# Patient Record
Sex: Female | Born: 1975
Health system: Southern US, Community
[De-identification: ages and names within clinical notes are randomized; demographics above are authoritative.]

## PROBLEM LIST (undated history)

## (undated) DIAGNOSIS — Z789 Other specified health status: Secondary | ICD-10-CM

## (undated) DIAGNOSIS — D649 Anemia, unspecified: Secondary | ICD-10-CM

## (undated) DIAGNOSIS — K219 Gastro-esophageal reflux disease without esophagitis: Secondary | ICD-10-CM

## (undated) DIAGNOSIS — N809 Endometriosis, unspecified: Secondary | ICD-10-CM

## (undated) HISTORY — PX: ABDOMINAL HYSTERECTOMY: SHX81

## (undated) HISTORY — PX: NO PAST SURGERIES: SHX2092

---

## 2011-08-24 ENCOUNTER — Emergency Department (HOSPITAL_BASED_OUTPATIENT_CLINIC_OR_DEPARTMENT_OTHER)
Admission: EM | Admit: 2011-08-24 | Discharge: 2011-08-24 | Disposition: A | Discharge status: Home / Self Care | Payer: BC Managed Care – PPO | Attending: Emergency Medicine | Admitting: Emergency Medicine

## 2011-08-24 ENCOUNTER — Encounter (HOSPITAL_BASED_OUTPATIENT_CLINIC_OR_DEPARTMENT_OTHER): Payer: Self-pay | Admitting: *Deleted

## 2011-08-24 DIAGNOSIS — M549 Dorsalgia, unspecified: Secondary | ICD-10-CM

## 2011-08-24 MED ORDER — METHOCARBAMOL 500 MG PO TABS: 500.0000 mg | ORAL_TABLET | Freq: Two times a day (BID) | ORAL | Status: DC

## 2011-08-24 MED ORDER — HYDROCODONE-ACETAMINOPHEN 5-325 MG PO TABS: 2.0000 | ORAL_TABLET | ORAL | Status: DC | PRN

## 2011-08-24 NOTE — ED Notes (Signed)
Pt c/o low back pain and rib pain x 3 days without known injury

## 2011-08-24 NOTE — ED Provider Notes (Signed)
History     CSN: [id number]  Arrival date & time 08/24/11  2143   First MD Initiated Contact with Patient 08/24/11 2214      Chief Complaint  Patient presents with  . Back Pain    (Consider location/radiation/quality/duration/timing/severity/associated sxs/prior treatment) Patient is a 36 y.o. female presenting with back pain. The history is provided by the patient. No language interpreter was used.  Back Pain  This is a new problem. Episode onset: 3 days ago. The problem occurs constantly. The problem has been gradually worsening. The pain is associated with no known injury. The pain is present in the lumbar spine and thoracic spine. The quality of the pain is described as aching. The symptoms are aggravated by certain positions. The pain is the same all the time. Stiffness is present all day. She has tried nothing for the symptoms. The treatment provided moderate relief.  Pt complains of pain in her mid low back  History reviewed. No pertinent past medical history.  History reviewed. No pertinent past surgical history.  History reviewed. No pertinent family history.  History  Substance Use Topics  . Smoking status: Never Smoker   . Smokeless tobacco: Not on file  . Alcohol Use: No    OB History    Grav Para Term Preterm Abortions TAB SAB Ect Mult Living                  Review of Systems  Musculoskeletal: Positive for back pain.  All other systems reviewed and are negative.    Allergies  Review of patient's allergies indicates no known allergies.  Home Medications   Current Outpatient Rx  Name Route Sig Dispense Refill  . HYDROCODONE-ACETAMINOPHEN 5-325 MG PO TABS Oral Take 2 tablets by mouth every 4 (four) hours as needed for pain. 20 tablet 0  . METHOCARBAMOL 500 MG PO TABS Oral Take 1 tablet (500 mg total) by mouth 2 (two) times daily. 20 tablet 0    BP 147/86  Pulse 73  Temp 98.6 F (37 C) (Oral)  Resp 18  Ht 5\' 6"  (1.676 m)  Wt 200 lb (90.719 kg)   BMI 32.28 kg/m2  SpO2 100%  LMP 08/24/2011  Physical Exam  Nursing note and vitals reviewed. Constitutional: She appears well-developed and well-nourished.  HENT:  Head: Normocephalic and atraumatic.  Neck: Normal range of motion.  Cardiovascular: Normal rate and normal heart sounds.   Pulmonary/Chest: Effort normal.  Abdominal: Soft.  Musculoskeletal: She exhibits tenderness.       Tender ls spine from,  Ns and nv intact,   Neurological: She is alert.  Skin: Skin is warm.  Psychiatric: She has a normal mood and affect.    ED Course  Procedures (including critical care time)  Labs Reviewed - No data to display No results found.   1. Back pain       MDM  Robaxin and hydrocodone        Lonia Skinner [place], [place] 08/24/11 2248

## 2011-08-28 NOTE — ED Provider Notes (Signed)
Medical screening examination/treatment/procedure(s) were performed by non-physician practitioner and as supervising physician I was immediately available for consultation/collaboration.  Shadi Larner, MD 08/28/11 2355 

## 2011-09-22 ENCOUNTER — Encounter: Payer: Self-pay | Admitting: Family Medicine

## 2011-09-22 ENCOUNTER — Ambulatory Visit (INDEPENDENT_AMBULATORY_CARE_PROVIDER_SITE_OTHER): Payer: BC Managed Care – PPO | Admitting: Family Medicine

## 2011-09-22 ENCOUNTER — Ambulatory Visit (HOSPITAL_BASED_OUTPATIENT_CLINIC_OR_DEPARTMENT_OTHER)
Admission: RE | Admit: 2011-09-22 | Discharge: 2011-09-22 | Disposition: A | Discharge status: Home / Self Care | Payer: BC Managed Care – PPO | Source: Ambulatory Visit | Attending: Family Medicine | Admitting: Family Medicine

## 2011-09-22 VITALS — BP 124/85 | HR 70 | Ht 67.0 in | Wt 200.0 lb

## 2011-09-22 DIAGNOSIS — M545 Low back pain, unspecified: Secondary | ICD-10-CM | POA: Insufficient documentation

## 2011-09-22 DIAGNOSIS — M47817 Spondylosis without myelopathy or radiculopathy, lumbosacral region: Secondary | ICD-10-CM | POA: Insufficient documentation

## 2011-09-22 MED ORDER — HYDROCODONE-ACETAMINOPHEN 5-325 MG PO TABS: 1.0000 | ORAL_TABLET | Freq: Four times a day (QID) | ORAL | Status: AC | PRN

## 2011-09-22 MED ORDER — METHOCARBAMOL 500 MG PO TABS: 500.0000 mg | ORAL_TABLET | Freq: Three times a day (TID) | ORAL | Status: AC | PRN

## 2011-09-22 MED ORDER — MELOXICAM 15 MG PO TABS: 15.0000 mg | ORAL_TABLET | Freq: Every day | ORAL | Status: DC

## 2011-09-22 NOTE — Patient Instructions (Addendum)
You have a lumbar strain and right SI joint dysfunction. Meloxicam daily with food for pain and inflammation. Norco as needed for severe pain (no driving on this medicine). Robaxin as needed for muscle spasms (no driving on this medicine if it makes you sleepy). Stay as active as possible. Do home exercises and stretches as directed - hold each for 20-30 seconds and do each one three times. Consider massage, chiropractor, physical therapy, and/or acupuncture. Physical therapy has been shown to be helpful while the others have mixed results - we are starting this and they will teach you the home exercises. Strengthening of low back muscles, abdominal musculature are key for long term pain relief. If not improving, will consider further imaging (MRI). Follow up with me in 5-6 weeks for reevaluation.

## 2011-09-23 ENCOUNTER — Encounter: Payer: Self-pay | Admitting: Family Medicine

## 2011-09-23 DIAGNOSIS — M545 Low back pain: Secondary | ICD-10-CM | POA: Insufficient documentation

## 2011-09-23 NOTE — Progress Notes (Signed)
  Subjective:    Patient ID: [name] [name], female    DOB: Jul 09, 1975, 36 y.o.   MRN: [id number]  PCP: None  HPI 36 yo F here for low back pain.  Patient denies known injury. States ever since mid-April has had right > left low back pain worse at end of day making it hard to sleep. No radiation into legs. No numbness or tingling. No prior back problems. No bowel or bladder dysfunction. Tried motrin, heat, and ice.  History reviewed. No pertinent past medical history.  No current outpatient prescriptions on file prior to visit.    History reviewed. No pertinent past surgical history.  No Known Allergies  History   Social History  . Marital Status: Married    Spouse Name: N/A    Number of Children: N/A  . Years of Education: N/A   Occupational History  . Not on file.   Social History Main Topics  . Smoking status: Never Smoker   . Smokeless tobacco: Not on file  . Alcohol Use: No  . Drug Use: No  . Sexually Active:    Other Topics Concern  . Not on file   Social History Narrative  . No narrative on file    Family History  Problem Relation Age of Onset  . Sudden death Father   . Heart attack Father   . Diabetes Neg Hx   . Hyperlipidemia Neg Hx   . Hypertension Neg Hx     BP 124/85  Pulse 70  Ht 5\' 7"  (1.702 m)  Wt 200 lb (90.719 kg)  BMI 31.32 kg/m2  LMP 09/18/2011  Review of Systems See HPI above.    Objective:   Physical Exam Gen: NAD  Back: No gross deformity, scoliosis. TTP R SI joint and bilateral paraspinal muscles.  No midline or bony TTP. FROM with pain on full flexion. Strength LEs 5/5 all muscle groups.   1+ MSRs in patellar and achilles tendons, equal bilaterally. Negative SLRs. Sensation intact to light touch bilaterally. Negative logroll bilateral hips + fabers on right, negative left.  Negative piriformis stretches.    Assessment & Plan:  1. Low back pain - radiographs with mild DDD.  Pain 2/2 muscle strain and right SI joint  dysfunction.  Start meloxicam with norco (q6h prn #60) and robaxin as needed.  Start PT with transition to HEP.  F/u in 5-6 weeks.  Consider further imaging (MRI) if not improving as expected.

## 2011-09-23 NOTE — Assessment & Plan Note (Signed)
radiographs with mild DDD.  Pain 2/2 muscle strain and right SI joint dysfunction.  Start meloxicam with norco (q6h prn #60) and robaxin as needed.  Start PT with transition to HEP.  F/u in 5-6 weeks.  Consider further imaging (MRI) if not improving as expected.

## 2011-10-04 ENCOUNTER — Encounter (HOSPITAL_BASED_OUTPATIENT_CLINIC_OR_DEPARTMENT_OTHER): Payer: Self-pay | Admitting: *Deleted

## 2011-10-04 ENCOUNTER — Emergency Department (HOSPITAL_BASED_OUTPATIENT_CLINIC_OR_DEPARTMENT_OTHER)
Admission: EM | Admit: 2011-10-04 | Discharge: 2011-10-04 | Disposition: A | Discharge status: Home / Self Care | Payer: BC Managed Care – PPO | Attending: Emergency Medicine | Admitting: Emergency Medicine

## 2011-10-04 DIAGNOSIS — R109 Unspecified abdominal pain: Secondary | ICD-10-CM | POA: Insufficient documentation

## 2011-10-04 LAB — URINALYSIS, ROUTINE W REFLEX MICROSCOPIC
Bilirubin Urine: NEGATIVE
Leukocytes, UA: NEGATIVE
Nitrite: NEGATIVE
Specific Gravity, Urine: 1.027 (ref 1.005–1.030)
pH: 6 (ref 5.0–8.0)

## 2011-10-04 LAB — PREGNANCY, URINE: Preg Test, Ur: NEGATIVE

## 2011-10-04 MED ORDER — HYDROCODONE-ACETAMINOPHEN 5-500 MG PO TABS: 1.0000 | ORAL_TABLET | Freq: Four times a day (QID) | ORAL | Status: AC | PRN

## 2011-10-04 MED ORDER — KETOROLAC TROMETHAMINE 30 MG/ML IJ SOLN
30.0000 mg | Freq: Once | INTRAMUSCULAR | Status: AC
  Administered 2011-10-04: 30 mg via INTRAMUSCULAR
  Filled 2011-10-04: qty 1

## 2011-10-04 MED ORDER — IBUPROFEN 600 MG PO TABS: 600.0000 mg | ORAL_TABLET | Freq: Three times a day (TID) | ORAL | Status: AC | PRN

## 2011-10-04 NOTE — ED Provider Notes (Signed)
History     CSN: [id number]  Arrival date & time 10/04/11  4098   First MD Initiated Contact with Patient 10/04/11 0450      Chief Complaint  Patient presents with  . Abdominal Pain    (Consider location/radiation/quality/duration/timing/severity/associated sxs/prior treatment) Patient is a 36 y.o. female presenting with abdominal pain. The history is provided by the patient.  Abdominal Pain The primary symptoms of the illness include abdominal pain. The primary symptoms of the illness do not include fever, shortness of breath, diarrhea, dysuria, vaginal discharge or vaginal bleeding.  Symptoms associated with the illness do not include constipation or back pain.  pt states awoke this morning with a sharp stabbing pain to suprapubic area. Constant. No radiation. No exacerbating or alleviating factors. Felt normal when went to bed last pm. lnmp 2 weeks ago. No vaginal discharge or bleeding. No urgency or frequency. No dysuria. No back or flank pain. No abd distension. Normal appetite. No nvd. Having normal bms. No constipation. No hx ovarian cysts. No hx kidney stones.     History reviewed. No pertinent past medical history.  History reviewed. No pertinent past surgical history.  Family History  Problem Relation Age of Onset  . Sudden death Father   . Heart attack Father   . Diabetes Neg Hx   . Hyperlipidemia Neg Hx   . Hypertension Neg Hx     History  Substance Use Topics  . Smoking status: Never Smoker   . Smokeless tobacco: Not on file  . Alcohol Use: No    OB History    Grav Para Term Preterm Abortions TAB SAB Ect Mult Living                  Review of Systems  Constitutional: Negative for fever.  Respiratory: Negative for shortness of breath.   Cardiovascular: Negative for chest pain.  Gastrointestinal: Positive for abdominal pain. Negative for diarrhea and constipation.  Genitourinary: Negative for dysuria, flank pain, vaginal bleeding and vaginal discharge.   Musculoskeletal: Negative for back pain.  Neurological: Negative for headaches.    Allergies  Review of patient's allergies indicates no known allergies.  Home Medications   Current Outpatient Rx  Name Route Sig Dispense Refill  . MELOXICAM 15 MG PO TABS Oral Take 1 tablet (15 mg total) by mouth daily. With food. 30 tablet 1    BP 127/73  Pulse 73  Temp 97.3 F (36.3 C) (Oral)  Resp 18  SpO2 100%  LMP 09/18/2011  Physical Exam  Nursing note and vitals reviewed. Constitutional: She is oriented to person, place, and time. She appears well-developed and well-nourished. No distress.  Eyes: Conjunctivae are normal. No scleral icterus.  Neck: Neck supple. No tracheal deviation present.  Cardiovascular: Normal rate.   Pulmonary/Chest: Effort normal. No respiratory distress.  Abdominal: Soft. Normal appearance and bowel sounds are normal. She exhibits no distension and no mass. There is no tenderness. There is no rebound and no guarding.  Genitourinary:       No cva tenderness  Musculoskeletal: She exhibits no edema.  Neurological: She is alert and oriented to person, place, and time.  Skin: Skin is warm and dry. No rash noted.  Psychiatric: She has a normal mood and affect.    ED Course  Procedures (including critical care time)   Labs Reviewed  URINALYSIS, ROUTINE W REFLEX MICROSCOPIC  PREGNANCY, URINE   Results for orders placed during the hospital encounter of 10/04/11  URINALYSIS, ROUTINE W REFLEX MICROSCOPIC  Component Value Range   Color, Urine YELLOW  YELLOW   APPearance CLEAR  CLEAR   Specific Gravity, Urine 1.027  1.005 - 1.030   pH 6.0  5.0 - 8.0   Glucose, UA NEGATIVE  NEGATIVE mg/dL   Hgb urine dipstick NEGATIVE  NEGATIVE   Bilirubin Urine NEGATIVE  NEGATIVE   Ketones, ur NEGATIVE  NEGATIVE mg/dL   Protein, ur NEGATIVE  NEGATIVE mg/dL   Urobilinogen, UA 0.2  0.0 - 1.0 mg/dL   Nitrite NEGATIVE  NEGATIVE   Leukocytes, UA NEGATIVE  NEGATIVE    PREGNANCY, URINE      Component Value Range   Preg Test, Ur NEGATIVE  NEGATIVE       MDM  Labs.  Ua/upreg neg. Pt drove self to ed. toradol im.  Discussed diff dx w pt including mittelschmerz, ruptured ovarian cyst. abd exam soft nt. No gu c/o or hematuria.        Suzi Roots, MD 10/04/11 [phone]

## 2011-10-04 NOTE — ED Notes (Addendum)
[facility] radiology called and given information regarding an u/s transvaginal if pt continues to have pain. They state scheduler  will call pt. this morning. Pt. aware of this. Order for u/s sent with pt.

## 2011-10-04 NOTE — ED Notes (Signed)
C/o lower pelvic pain that woke pt. Up from her sleep 2 hours ago. Describes as a sharp/stabbing type pain no radiation of pain.  Has had nausea denies vomiting. Denies fevers. Denies any urinary symptoms.

## 2011-10-04 NOTE — ED Notes (Signed)
MD at bedside. 

## 2011-10-04 NOTE — ED Notes (Signed)
Waiting for xray to speak with pt. Regarding her u/s.

## 2011-10-06 ENCOUNTER — Inpatient Hospital Stay (HOSPITAL_BASED_OUTPATIENT_CLINIC_OR_DEPARTMENT_OTHER): Admit: 2011-10-06 | Payer: BC Managed Care – PPO

## 2011-10-06 ENCOUNTER — Ambulatory Visit: Payer: BC Managed Care – PPO | Attending: Family Medicine | Admitting: Rehabilitation

## 2011-11-03 ENCOUNTER — Ambulatory Visit: Payer: BC Managed Care – PPO | Admitting: Family Medicine

## 2011-11-06 ENCOUNTER — Other Ambulatory Visit (HOSPITAL_COMMUNITY)
Admission: RE | Admit: 2011-11-06 | Discharge: 2011-11-06 | Disposition: A | Discharge status: Home / Self Care | Payer: BC Managed Care – PPO | Source: Ambulatory Visit | Attending: Obstetrics & Gynecology | Admitting: Obstetrics & Gynecology

## 2011-11-06 ENCOUNTER — Other Ambulatory Visit: Payer: Self-pay | Admitting: Family Medicine

## 2011-11-06 DIAGNOSIS — N63 Unspecified lump in unspecified breast: Secondary | ICD-10-CM

## 2011-11-06 DIAGNOSIS — Z1151 Encounter for screening for human papillomavirus (HPV): Secondary | ICD-10-CM | POA: Insufficient documentation

## 2011-11-06 DIAGNOSIS — Z124 Encounter for screening for malignant neoplasm of cervix: Secondary | ICD-10-CM | POA: Insufficient documentation

## 2011-11-07 ENCOUNTER — Ambulatory Visit
Admission: RE | Admit: 2011-11-07 | Discharge: 2011-11-07 | Disposition: A | Discharge status: Home / Self Care | Payer: BC Managed Care – PPO | Source: Ambulatory Visit | Attending: Family Medicine | Admitting: Family Medicine

## 2011-11-07 DIAGNOSIS — N63 Unspecified lump in unspecified breast: Secondary | ICD-10-CM

## 2011-11-09 ENCOUNTER — Encounter (HOSPITAL_BASED_OUTPATIENT_CLINIC_OR_DEPARTMENT_OTHER): Payer: Self-pay

## 2011-11-09 ENCOUNTER — Emergency Department (HOSPITAL_BASED_OUTPATIENT_CLINIC_OR_DEPARTMENT_OTHER)
Admission: EM | Admit: 2011-11-09 | Discharge: 2011-11-09 | Disposition: A | Discharge status: Home / Self Care | Payer: BC Managed Care – PPO | Attending: Emergency Medicine | Admitting: Emergency Medicine

## 2011-11-09 DIAGNOSIS — R109 Unspecified abdominal pain: Secondary | ICD-10-CM | POA: Insufficient documentation

## 2011-11-09 DIAGNOSIS — N809 Endometriosis, unspecified: Secondary | ICD-10-CM

## 2011-11-09 HISTORY — DX: Endometriosis, unspecified: N80.9

## 2011-11-09 MED ORDER — IBUPROFEN 800 MG PO TABS
800.0000 mg | ORAL_TABLET | Freq: Once | ORAL | Status: AC
  Administered 2011-11-09: 800 mg via ORAL
  Filled 2011-11-09: qty 1

## 2011-11-09 MED ORDER — IBUPROFEN 800 MG PO TABS: ORAL_TABLET | ORAL | Status: DC

## 2011-11-09 MED ORDER — HYDROMORPHONE HCL 2 MG PO TABS: 2.0000 mg | ORAL_TABLET | ORAL | Status: DC | PRN

## 2011-11-09 NOTE — ED Notes (Signed)
MD @ BS

## 2011-11-09 NOTE — ED Provider Notes (Signed)
History     CSN: [id number]  Arrival date & time 11/09/11  0411   First MD Initiated Contact with Patient 11/09/11 0424      Chief Complaint  Patient presents with  . Abdominal Pain    (Consider location/radiation/quality/duration/timing/severity/associated sxs/prior treatment) HPI This is a 36 year old white female with about a four-month history of irregular periods and a several month history of pelvic pain. She was seen by her OB/GYN 2 days ago and given a diagnosis of endometriosis. She relates that on speculum exam her physician saw 7 cysts which she said were consistent with endometriosis. She is having vaginal bleeding which and present for several days. She is due to start birth control today to help regulate her cycles breast endometrium. She was treated with oxycodone 5 mg tablets which have not adequately relieved her pain. Her pain became severe and she was unable to sleep. She has also been taking ibuprofen without relief. She is here requesting pain relief pending her followup appointment in 4 days.  The pain is sharp and located in her left suprapubic region. It is worse with palpation or movement.  Past Medical History  Diagnosis Date  . Endometriosis     History reviewed. No pertinent past surgical history.  Family History  Problem Relation Age of Onset  . Sudden death Father   . Heart attack Father   . Diabetes Neg Hx   . Hyperlipidemia Neg Hx   . Hypertension Neg Hx     History  Substance Use Topics  . Smoking status: Never Smoker   . Smokeless tobacco: Not on file  . Alcohol Use: No    OB History    Grav Para Term Preterm Abortions TAB SAB Ect Mult Living                  Review of Systems  All other systems reviewed and are negative.    Allergies  Review of patient's allergies indicates no known allergies.  Home Medications   Current Outpatient Rx  Name Route Sig Dispense Refill  . OXYCODONE HCL 5 MG PO TABS Oral Take 5 mg by mouth  every 4 (four) hours as needed.      BP 109/75  Pulse 91  Temp 98.3 F (36.8 C)  Resp 16  SpO2 99%  Physical Exam General: Well-developed, well-nourished female in no acute distress; appearance consistent with age of record HENT: normocephalic, atraumatic Eyes: pupils equal round and reactive to light; extraocular muscles intact Neck: supple Heart: regular rate and rhythm Lungs: clear to auscultation bilaterally Abdomen: soft; nondistended; left suprapubic tenderness; bowel sounds present Extremities: No deformity; full range of motion Neurologic: Awake, alert and oriented; motor function intact in all extremities and symmetric; no facial droop Skin: Warm and dry Psychiatric: Normal mood and affect    ED Course  Procedures (including critical care time)     MDM  We'll change her analgesics and have her call her OB/GYN tomorrow. She was advised to start her birth control today as instructed.        Hanley Seamen, MD 11/09/11 [phone]

## 2011-11-09 NOTE — ED Notes (Signed)
Patient reports that she was recently seen at GYN and diagnosed with increasing endometriosis. Taking oxycodone w/o relief. Having irregular bleeding and backpain

## 2011-12-06 ENCOUNTER — Encounter (HOSPITAL_BASED_OUTPATIENT_CLINIC_OR_DEPARTMENT_OTHER): Payer: Self-pay | Admitting: *Deleted

## 2011-12-06 ENCOUNTER — Emergency Department (HOSPITAL_BASED_OUTPATIENT_CLINIC_OR_DEPARTMENT_OTHER)
Admission: EM | Admit: 2011-12-06 | Discharge: 2011-12-06 | Disposition: A | Discharge status: Home / Self Care | Payer: BC Managed Care – PPO | Attending: Emergency Medicine | Admitting: Emergency Medicine

## 2011-12-06 DIAGNOSIS — N809 Endometriosis, unspecified: Secondary | ICD-10-CM | POA: Insufficient documentation

## 2011-12-06 LAB — PREGNANCY, URINE: Preg Test, Ur: NEGATIVE

## 2011-12-06 MED ORDER — ONDANSETRON HCL 4 MG/2ML IJ SOLN
4.0000 mg | Freq: Once | INTRAMUSCULAR | Status: AC
  Administered 2011-12-06: 4 mg via INTRAVENOUS
  Filled 2011-12-06: qty 2

## 2011-12-06 MED ORDER — HYDROMORPHONE HCL PF 1 MG/ML IJ SOLN
1.0000 mg | Freq: Once | INTRAMUSCULAR | Status: AC
  Administered 2011-12-06: 1 mg via INTRAVENOUS
  Filled 2011-12-06: qty 1

## 2011-12-06 MED ORDER — OXYCODONE HCL ER 10 MG PO T12A: 10.0000 mg | EXTENDED_RELEASE_TABLET | Freq: Two times a day (BID) | ORAL | Status: DC

## 2011-12-06 NOTE — ED Provider Notes (Signed)
History   This chart was scribed for Nelia Shi, MD by Albertha Ghee Rifaie. This patient was seen in room MH06/MH06 and the patient's care was started at 7:07 PM.   CSN: [id number]  Arrival date & time 12/06/11  1711   First MD Initiated Contact with Patient 12/06/11 1907      Chief Complaint  Patient presents with  . Abdominal Pain    Patient is a 36 y.o. female presenting with abdominal pain. The history is provided by the patient. No language interpreter was used.  Abdominal Pain The primary symptoms of the illness include abdominal pain.    [name] [name] is a 36 y.o. female who presents to the Emergency Department complaining of a month of gradual onset, gradually worsening, sever, intermittent abd pain that is associated with vomiting, and heavy period. Pt was seen several times by her PCP (Dr. [name]) for the same symptoms and was prescribed Tramadol and Motrin but didn't provide any relief. She also states that she was diagnosed with endometriosis and have had multiple appt, and biopsies. She denis having fever, chills as associated symptoms. Pt denies smoking and alcohol use.     Past Medical History  Diagnosis Date  . Endometriosis     History reviewed. No pertinent past surgical history.  Family History  Problem Relation Age of Onset  . Sudden death Father   . Heart attack Father   . Diabetes Neg Hx   . Hyperlipidemia Neg Hx   . Hypertension Neg Hx     History  Substance Use Topics  . Smoking status: Never Smoker   . Smokeless tobacco: Not on file  . Alcohol Use: No    No OB history provided  Review of Systems  Gastrointestinal: Positive for abdominal pain.  All other systems reviewed and are negative.    Allergies  Tylenol  Home Medications   Current Outpatient Rx  Name Route Sig Dispense Refill  . HYDROMORPHONE HCL 2 MG PO TABS Oral Take 1 tablet (2 mg total) by mouth every 4 (four) hours as needed for pain. 20 tablet 0  . IBUPROFEN 800 MG  PO TABS  Take 1 tablet every 8 hours as needed for uterine cramping. 30 tablet 0  . OXYCODONE HCL 5 MG PO TABS Oral Take 5 mg by mouth every 4 (four) hours as needed.      BP 125/80  Pulse 97  Temp 98.9 F (37.2 C) (Oral)  Resp 20  Ht 5\' 7"  (1.702 m)  Wt 215 lb (97.523 kg)  BMI 33.67 kg/m2  SpO2 97%  Physical Exam  Nursing note and vitals reviewed. Constitutional: She is oriented to person, place, and time. She appears well-developed and well-nourished. No distress.  HENT:  Head: Normocephalic and atraumatic.  Eyes: Pupils are equal, round, and reactive to light.  Neck: Normal range of motion.  Cardiovascular: Normal rate and intact distal pulses.   Pulmonary/Chest: No respiratory distress.  Abdominal: Normal appearance. She exhibits no distension. There is no tenderness. There is no rebound and no guarding.  Musculoskeletal: Normal range of motion.  Neurological: She is alert and oriented to person, place, and time. No cranial nerve deficit.  Skin: Skin is warm and dry. No rash noted.  Psychiatric: She has a normal mood and affect. Her behavior is normal.    ED Course  Procedures (including critical care time)  Medications  HYDROmorphone (DILAUDID) injection 1 mg (not administered)  ondansetron (ZOFRAN) injection 4 mg (4 mg Intravenous Given  12/06/11 1945)  HYDROmorphone (DILAUDID) injection 1 mg (1 mg Intravenous Given 12/06/11 1945)     Labs Reviewed  PREGNANCY, URINE   No results found.  DIAGNOSTIC STUDIES: Oxygen Saturation is 97% on room air, adequate by my interpretation.    COORDINATION OF CARE: 7:17 PM Discussed treatment plan with pt at bedside and pt agreed to plan.  No diagnosis found.    MDM  I personally performed the services described in this documentation, which was scribed in my presence. The recorded information has been reviewed and considered.    Patient has outpatient followup for this condition and has been worked up extensively.   Denies visit was strictly for pain control.  After treatment in the ED the patient feels back to baseline and wants to go home.   Nelia Shi, MD 12/06/11 2033

## 2011-12-06 NOTE — ED Notes (Signed)
Pt with plan for treatment for endometriosis but poor pain control.

## 2011-12-06 NOTE — ED Notes (Signed)
Pt states she was here about 1-1/2 months ago for similar s/s. Has had multiple appt, biopsies, etc. since and they have told her she has endometriosis. C/O heavy periods, pain.No relief with Tramadol or Motrin.

## 2011-12-14 ENCOUNTER — Emergency Department (HOSPITAL_BASED_OUTPATIENT_CLINIC_OR_DEPARTMENT_OTHER)
Admission: EM | Admit: 2011-12-14 | Discharge: 2011-12-14 | Disposition: A | Discharge status: Home / Self Care | Payer: BC Managed Care – PPO | Attending: Emergency Medicine | Admitting: Emergency Medicine

## 2011-12-14 ENCOUNTER — Encounter (HOSPITAL_BASED_OUTPATIENT_CLINIC_OR_DEPARTMENT_OTHER): Payer: Self-pay | Admitting: *Deleted

## 2011-12-14 DIAGNOSIS — N809 Endometriosis, unspecified: Secondary | ICD-10-CM | POA: Insufficient documentation

## 2011-12-14 MED ORDER — HYDROMORPHONE HCL PF 2 MG/ML IJ SOLN
2.0000 mg | Freq: Once | INTRAMUSCULAR | Status: AC
  Administered 2011-12-14: 2 mg via INTRAMUSCULAR
  Filled 2011-12-14: qty 1

## 2011-12-14 MED ORDER — OXYCODONE HCL 7.5 MG PO TABS: 1.0000 | ORAL_TABLET | Freq: Four times a day (QID) | ORAL | Status: DC | PRN

## 2011-12-14 NOTE — ED Provider Notes (Signed)
History     CSN: [id number]  Arrival date & time 12/14/11  4098   First MD Initiated Contact with Patient 12/14/11 [phone]      Chief Complaint  Patient presents with  . Abdominal Pain    (Consider location/radiation/quality/duration/timing/severity/associated sxs/prior treatment) HPI Comments: Patient with history of endometriosis followed by Gyn.  She is currently taking tramadol for her pain which is not helping.  She called her Gyn who told her they could not call in any medications and she should go to the ER.  She reports this pain is identical to her endometriosis pain and she has no other symptoms such as dysuria or hematuria.  No fevers.  Patient is a 36 y.o. female presenting with abdominal pain. The history is provided by the patient.  Abdominal Pain The primary symptoms of the illness include abdominal pain. The primary symptoms of the illness do not include fever, nausea, vomiting, dysuria or vaginal bleeding. Episode onset: several months ago. The onset of the illness was gradual. The problem has been gradually worsening.  The patient states that she believes she is currently not pregnant. The patient has not had a change in bowel habit. Symptoms associated with the illness do not include chills.    Past Medical History  Diagnosis Date  . Endometriosis     History reviewed. No pertinent past surgical history.  Family History  Problem Relation Age of Onset  . Sudden death Father   . Heart attack Father   . Diabetes Neg Hx   . Hyperlipidemia Neg Hx   . Hypertension Neg Hx     History  Substance Use Topics  . Smoking status: Never Smoker   . Smokeless tobacco: Not on file  . Alcohol Use: No    OB History    Grav Para Term Preterm Abortions TAB SAB Ect Mult Living                  Review of Systems  Constitutional: Negative for fever and chills.  Gastrointestinal: Positive for abdominal pain. Negative for nausea and vomiting.  Genitourinary: Negative for  dysuria and vaginal bleeding.  All other systems reviewed and are negative.    Allergies  Tylenol  Home Medications   Current Outpatient Rx  Name Route Sig Dispense Refill  . NORGESTIM-ETH ESTRAD TRIPHASIC 0.18/0.215/0.25 MG-25 MCG PO TABS Oral Take 1 tablet by mouth daily.    Marland Kitchen HYDROMORPHONE HCL 2 MG PO TABS Oral Take 1 tablet (2 mg total) by mouth every 4 (four) hours as needed for pain. 20 tablet 0  . IBUPROFEN 800 MG PO TABS  Take 1 tablet every 8 hours as needed for uterine cramping. 30 tablet 0  . OXYCODONE HCL 5 MG PO TABS Oral Take 5 mg by mouth every 4 (four) hours as needed.    . OXYCODONE HCL ER 10 MG PO T12A Oral Take 1 tablet (10 mg total) by mouth every 12 (twelve) hours. 20 tablet 0    BP 130/80  Pulse 73  Temp 98.4 F (36.9 C) (Oral)  Resp 20  Ht 5\' 7"  (1.702 m)  Wt 215 lb (97.523 kg)  BMI 33.67 kg/m2  SpO2 100%  LMP 09/13/2011  Physical Exam  Nursing note and vitals reviewed. Constitutional: She is oriented to person, place, and time. She appears well-developed and well-nourished.  HENT:  Head: Normocephalic and atraumatic.  Mouth/Throat: Oropharynx is clear and moist.  Neck: Normal range of motion. Neck supple.  Cardiovascular: Normal rate and  regular rhythm.   No murmur heard. Pulmonary/Chest: Effort normal and breath sounds normal.  Abdominal: Soft. Bowel sounds are normal.       There is mild ttp in the lower abdomen without rebound or guarding.    Musculoskeletal: Normal range of motion.  Neurological: She is alert and oriented to person, place, and time.  Skin: Skin is warm and dry.    ED Course  Procedures (including critical care time)  Labs Reviewed - No data to display No results found.   No diagnosis found.    MDM  I will prescribe oxycodone for her today.  She has been advised that she needs follow up with her Gyn this week for follow up.  I do not feel as though any additional workup is indicated today.        Geoffery Lyons, MD 12/14/11 [phone]

## 2011-12-14 NOTE — ED Notes (Signed)
Patient states she has endometriosis pain for several months and is working with her GYN.  States the abdominal pain is increasing and tramadol is not helping.  Has been on BC pills for several months and for the last few days she had been spotting.

## 2012-01-10 ENCOUNTER — Emergency Department (HOSPITAL_BASED_OUTPATIENT_CLINIC_OR_DEPARTMENT_OTHER)
Admission: EM | Admit: 2012-01-10 | Discharge: 2012-01-10 | Disposition: A | Discharge status: Home / Self Care | Payer: BC Managed Care – PPO | Attending: Emergency Medicine | Admitting: Emergency Medicine

## 2012-01-10 ENCOUNTER — Encounter (HOSPITAL_BASED_OUTPATIENT_CLINIC_OR_DEPARTMENT_OTHER): Payer: Self-pay | Admitting: Emergency Medicine

## 2012-01-10 DIAGNOSIS — Z79899 Other long term (current) drug therapy: Secondary | ICD-10-CM | POA: Insufficient documentation

## 2012-01-10 DIAGNOSIS — N809 Endometriosis, unspecified: Secondary | ICD-10-CM | POA: Insufficient documentation

## 2012-01-10 LAB — URINALYSIS, ROUTINE W REFLEX MICROSCOPIC
Bilirubin Urine: NEGATIVE
Hgb urine dipstick: NEGATIVE
Ketones, ur: NEGATIVE mg/dL
Nitrite: NEGATIVE
Protein, ur: NEGATIVE mg/dL
Urobilinogen, UA: 0.2 mg/dL (ref 0.0–1.0)

## 2012-01-10 LAB — PREGNANCY, URINE: Preg Test, Ur: NEGATIVE

## 2012-01-10 MED ORDER — KETOROLAC TROMETHAMINE 30 MG/ML IJ SOLN
30.0000 mg | Freq: Once | INTRAMUSCULAR | Status: AC
  Administered 2012-01-10: 30 mg via INTRAVENOUS
  Filled 2012-01-10: qty 1

## 2012-01-10 MED ORDER — HYDROMORPHONE HCL PF 1 MG/ML IJ SOLN
1.0000 mg | Freq: Once | INTRAMUSCULAR | Status: AC
  Administered 2012-01-10: 1 mg via INTRAVENOUS
  Filled 2012-01-10: qty 1

## 2012-01-10 MED ORDER — ONDANSETRON 4 MG PO TBDP: 4.0000 mg | ORAL_TABLET | Freq: Three times a day (TID) | ORAL | Status: DC | PRN

## 2012-01-10 MED ORDER — ONDANSETRON HCL 4 MG/2ML IJ SOLN
4.0000 mg | Freq: Once | INTRAMUSCULAR | Status: AC
  Administered 2012-01-10: 4 mg via INTRAVENOUS
  Filled 2012-01-10: qty 2

## 2012-01-10 MED ORDER — HYDROCODONE-ACETAMINOPHEN 5-500 MG PO TABS: 1.0000 | ORAL_TABLET | Freq: Four times a day (QID) | ORAL | Status: DC | PRN

## 2012-01-10 NOTE — ED Notes (Signed)
Pt having abdominal pain, which is chronic for pt.  Pt states her endometriosis is causing the pain and that she is planning a hysterectomy in December.  Some N/V/D.  No vaginal discharge.

## 2012-01-10 NOTE — ED Provider Notes (Signed)
History     CSN: [id number]  Arrival date & time 01/10/12  4098   First MD Initiated Contact with Patient 01/10/12 1003      Chief Complaint  Patient presents with  . Abdominal Cramping     HPI Patient presents with chief complaint of abdominal pain.  Patient has history of endometriosis and pain that she is experiencing is similar or identical to that.  Patient denies any fever.  Patient has had some loose stools.  Patient denies any nausea or vomiting.  Patient is scheduling of hysterectomy prior to the holidays.  Patient has had workup for similar pains in the past. Past Medical History  Diagnosis Date  . Endometriosis     No past surgical history on file.  Family History  Problem Relation Age of Onset  . Sudden death Father   . Heart attack Father   . Diabetes Neg Hx   . Hyperlipidemia Neg Hx   . Hypertension Neg Hx     History  Substance Use Topics  . Smoking status: Never Smoker   . Smokeless tobacco: Not on file  . Alcohol Use: No    OB History    Grav Para Term Preterm Abortions TAB SAB Ect Mult Living                  Review of Systems All other systems reviewed and are negative Allergies  Tylenol  Home Medications   Current Outpatient Rx  Name  Route  Sig  Dispense  Refill  . HYDROCODONE-ACETAMINOPHEN 5-500 MG PO TABS   Oral   Take 1-2 tablets by mouth every 6 (six) hours as needed for pain.   20 tablet   0   . IBUPROFEN 800 MG PO TABS      Take 1 tablet every 8 hours as needed for uterine cramping.   30 tablet   0   . NORGESTIM-ETH ESTRAD TRIPHASIC 0.18/0.215/0.25 MG-25 MCG PO TABS   Oral   Take 1 tablet by mouth daily.         Marland Kitchen ONDANSETRON 4 MG PO TBDP   Oral   Take 1 tablet (4 mg total) by mouth every 8 (eight) hours as needed for nausea.   20 tablet   0     BP 130/81  Pulse 80  Temp 99.1 F (37.3 C) (Oral)  Resp 16  Ht 5\' 7"  (1.702 m)  Wt 206 lb (93.441 kg)  BMI 32.26 kg/m2  SpO2 98%  LMP 12/24/2011  Physical  Exam  Nursing note and vitals reviewed. Constitutional: She is oriented to person, place, and time. She appears well-developed and well-nourished. No distress.  HENT:  Head: Normocephalic and atraumatic.  Eyes: Pupils are equal, round, and reactive to light.  Neck: Normal range of motion.  Cardiovascular: Normal rate and intact distal pulses.   Pulmonary/Chest: No respiratory distress.  Abdominal: Normal appearance. She exhibits no distension. There is no tenderness. There is no rebound and no guarding.  Musculoskeletal: Normal range of motion.  Neurological: She is alert and oriented to person, place, and time. No cranial nerve deficit.  Skin: Skin is warm and dry. No rash noted.  Psychiatric: She has a normal mood and affect. Her behavior is normal.    ED Course  Procedures (including critical care time)  Medications  HYDROcodone-acetaminophen (VICODIN) 5-500 MG per tablet (not administered)  ondansetron (ZOFRAN ODT) 4 MG disintegrating tablet (not administered)  ondansetron (ZOFRAN) injection 4 mg (4 mg Intravenous Given 01/10/12  1017)  ketorolac (TORADOL) 30 MG/ML injection 30 mg (30 mg Intravenous Given 01/10/12 1017)  HYDROmorphone (DILAUDID) injection 1 mg (1 mg Intravenous Given 01/10/12 1017)     Labs Reviewed  PREGNANCY, URINE  URINALYSIS, ROUTINE W REFLEX MICROSCOPIC   No results found.   1. Endometriosis       MDM   After treatment in the ED the patient feels back to baseline and wants to go home.        Nelia Shi, MD 01/10/12 [phone]

## 2012-01-20 ENCOUNTER — Emergency Department (HOSPITAL_BASED_OUTPATIENT_CLINIC_OR_DEPARTMENT_OTHER)
Admission: EM | Admit: 2012-01-20 | Discharge: 2012-01-20 | Disposition: A | Discharge status: Home / Self Care | Payer: BC Managed Care – PPO | Attending: Emergency Medicine | Admitting: Emergency Medicine

## 2012-01-20 DIAGNOSIS — Z791 Long term (current) use of non-steroidal anti-inflammatories (NSAID): Secondary | ICD-10-CM | POA: Insufficient documentation

## 2012-01-20 DIAGNOSIS — N949 Unspecified condition associated with female genital organs and menstrual cycle: Secondary | ICD-10-CM | POA: Insufficient documentation

## 2012-01-20 DIAGNOSIS — R112 Nausea with vomiting, unspecified: Secondary | ICD-10-CM | POA: Insufficient documentation

## 2012-01-20 DIAGNOSIS — M549 Dorsalgia, unspecified: Secondary | ICD-10-CM | POA: Insufficient documentation

## 2012-01-20 DIAGNOSIS — Z79899 Other long term (current) drug therapy: Secondary | ICD-10-CM | POA: Insufficient documentation

## 2012-01-20 DIAGNOSIS — N809 Endometriosis, unspecified: Secondary | ICD-10-CM | POA: Insufficient documentation

## 2012-01-20 LAB — URINALYSIS, ROUTINE W REFLEX MICROSCOPIC
Bilirubin Urine: NEGATIVE
Glucose, UA: NEGATIVE mg/dL
Hgb urine dipstick: NEGATIVE
Protein, ur: NEGATIVE mg/dL
Urobilinogen, UA: 0.2 mg/dL (ref 0.0–1.0)

## 2012-01-20 MED ORDER — KETOROLAC TROMETHAMINE 60 MG/2ML IM SOLN
60.0000 mg | Freq: Once | INTRAMUSCULAR | Status: AC
  Administered 2012-01-20: 60 mg via INTRAMUSCULAR
  Filled 2012-01-20: qty 2

## 2012-01-20 MED ORDER — OXYCODONE-ACETAMINOPHEN 5-325 MG PO TABS: 2.0000 | ORAL_TABLET | ORAL | Status: DC | PRN

## 2012-01-20 MED ORDER — HYDROMORPHONE HCL PF 2 MG/ML IJ SOLN
2.0000 mg | Freq: Once | INTRAMUSCULAR | Status: AC
  Administered 2012-01-20: 2 mg via INTRAMUSCULAR
  Filled 2012-01-20: qty 1

## 2012-01-20 MED ORDER — IBUPROFEN 800 MG PO TABS: 800.0000 mg | ORAL_TABLET | Freq: Three times a day (TID) | ORAL | Status: DC

## 2012-01-20 MED ORDER — ONDANSETRON 4 MG PO TBDP
4.0000 mg | ORAL_TABLET | Freq: Once | ORAL | Status: AC
  Administered 2012-01-20: 4 mg via ORAL
  Filled 2012-01-20: qty 1

## 2012-01-20 NOTE — ED Notes (Signed)
Pt c/o abd pain and vomiting. Pt states symptoms are same with endometriosis flare ups. Pt states she has pre-op apt with OB/gyn 12/9

## 2012-01-20 NOTE — ED Provider Notes (Signed)
History     CSN: [id number]  Arrival date & time 01/20/12  0143   First MD Initiated Contact with Patient 01/20/12 0203      Chief Complaint  Patient presents with  . Abdominal Pain  . Emesis    (Consider location/radiation/quality/duration/timing/severity/associated sxs/prior treatment) HPI Comments: Patient states she has a history of endometriosis having lower abdominal pain that is exactly the same as previous episodes.  The pain radiates to her low back. Associated with nausea and vomiting. She's had frequent loose stools. She is in the process of scheduling a hysterectomy with Dr. [name] and has an appointment on December 9. She takes Motrin at home for pain that is not helped. She's had previous visits for the same. No fever, dysuria, hematuria, vaginal bleeding or discharge.  The history is provided by the patient.    Past Medical History  Diagnosis Date  . Endometriosis     No past surgical history on file.  Family History  Problem Relation Age of Onset  . Sudden death Father   . Heart attack Father   . Diabetes Neg Hx   . Hyperlipidemia Neg Hx   . Hypertension Neg Hx     History  Substance Use Topics  . Smoking status: Never Smoker   . Smokeless tobacco: Not on file  . Alcohol Use: No    OB History    Grav Para Term Preterm Abortions TAB SAB Ect Mult Living                  Review of Systems  Constitutional: Negative for fever, activity change and appetite change.  Respiratory: Negative for cough, chest tightness and shortness of breath.   Cardiovascular: Negative for chest pain.  Gastrointestinal: Positive for nausea, vomiting and abdominal pain.  Genitourinary: Positive for pelvic pain. Negative for dysuria, hematuria and vaginal bleeding.  Musculoskeletal: Positive for back pain.  Skin: Negative for rash.  Neurological: Negative for headaches.    Allergies  Tylenol  Home Medications   Current Outpatient Rx  Name  Route  Sig  Dispense   Refill  . HYDROCODONE-ACETAMINOPHEN 5-500 MG PO TABS   Oral   Take 1-2 tablets by mouth every 6 (six) hours as needed for pain.   20 tablet   0   . IBUPROFEN 800 MG PO TABS      Take 1 tablet every 8 hours as needed for uterine cramping.   30 tablet   0   . IBUPROFEN 800 MG PO TABS   Oral   Take 1 tablet (800 mg total) by mouth 3 (three) times daily.   21 tablet   0   . NORGESTIM-ETH ESTRAD TRIPHASIC 0.18/0.215/0.25 MG-25 MCG PO TABS   Oral   Take 1 tablet by mouth daily.         Marland Kitchen ONDANSETRON 4 MG PO TBDP   Oral   Take 1 tablet (4 mg total) by mouth every 8 (eight) hours as needed for nausea.   20 tablet   0   . OXYCODONE-ACETAMINOPHEN 5-325 MG PO TABS   Oral   Take 2 tablets by mouth every 4 (four) hours as needed for pain.   15 tablet   0     BP 143/78  Pulse 82  Temp 97.7 F (36.5 C) (Oral)  Resp 18  SpO2 100%  LMP 12/24/2011  Physical Exam  Constitutional: She is oriented to person, place, and time. She appears well-developed and well-nourished. No distress.  HENT:  Head: Normocephalic and atraumatic.  Left Ear: External ear normal.  Mouth/Throat: Oropharynx is clear and moist. No oropharyngeal exudate.  Eyes: Conjunctivae normal and EOM are normal. Pupils are equal, round, and reactive to light.  Neck: Normal range of motion. Neck supple.  Cardiovascular: Normal rate, regular rhythm and normal heart sounds.   No murmur heard. Pulmonary/Chest: Effort normal and breath sounds normal. No respiratory distress.  Abdominal: Soft. There is tenderness. There is no rebound and no guarding.       Mild LLQ pain  Musculoskeletal: Normal range of motion. She exhibits no edema and no tenderness.       No CVAT  Neurological: She is alert and oriented to person, place, and time. No cranial nerve deficit. She exhibits normal muscle tone. Coordination normal.  Skin: Skin is warm.    ED Course  Procedures (including critical care time)   Labs Reviewed   URINALYSIS, ROUTINE W REFLEX MICROSCOPIC  PREGNANCY, URINE   No results found.   1. Endometriosis       MDM  Lower abdominal pain indentical to previous episodes of endometriosis. Vital stable, no distress. Abdomen soft and nonsurgical. Urinalysis negative, hCG negative.  Will treat pain, stressed followup with Dr. [name] for scheduled hysterectomy.       Glynn Octave, MD 01/20/12 [phone]

## 2012-01-24 ENCOUNTER — Encounter (HOSPITAL_BASED_OUTPATIENT_CLINIC_OR_DEPARTMENT_OTHER): Payer: Self-pay | Admitting: Emergency Medicine

## 2012-01-24 ENCOUNTER — Emergency Department (HOSPITAL_BASED_OUTPATIENT_CLINIC_OR_DEPARTMENT_OTHER)
Admission: EM | Admit: 2012-01-24 | Discharge: 2012-01-24 | Disposition: A | Discharge status: Home / Self Care | Payer: BC Managed Care – PPO | Attending: Emergency Medicine | Admitting: Emergency Medicine

## 2012-01-24 ENCOUNTER — Encounter (HOSPITAL_BASED_OUTPATIENT_CLINIC_OR_DEPARTMENT_OTHER): Payer: Self-pay | Admitting: *Deleted

## 2012-01-24 DIAGNOSIS — Z8742 Personal history of other diseases of the female genital tract: Secondary | ICD-10-CM | POA: Insufficient documentation

## 2012-01-24 DIAGNOSIS — Z791 Long term (current) use of non-steroidal anti-inflammatories (NSAID): Secondary | ICD-10-CM | POA: Insufficient documentation

## 2012-01-24 DIAGNOSIS — Z79899 Other long term (current) drug therapy: Secondary | ICD-10-CM | POA: Insufficient documentation

## 2012-01-24 DIAGNOSIS — N809 Endometriosis, unspecified: Secondary | ICD-10-CM | POA: Insufficient documentation

## 2012-01-24 DIAGNOSIS — R102 Pelvic and perineal pain: Secondary | ICD-10-CM

## 2012-01-24 DIAGNOSIS — N949 Unspecified condition associated with female genital organs and menstrual cycle: Secondary | ICD-10-CM | POA: Insufficient documentation

## 2012-01-24 DIAGNOSIS — G8929 Other chronic pain: Secondary | ICD-10-CM | POA: Insufficient documentation

## 2012-01-24 MED ORDER — CYCLOBENZAPRINE HCL 10 MG PO TABS
5.0000 mg | ORAL_TABLET | Freq: Once | ORAL | Status: AC
  Administered 2012-01-24: 5 mg via ORAL
  Filled 2012-01-24: qty 1

## 2012-01-24 MED ORDER — CYCLOBENZAPRINE HCL 10 MG PO TABS: 10.0000 mg | ORAL_TABLET | Freq: Two times a day (BID) | ORAL | Status: DC | PRN

## 2012-01-24 NOTE — ED Notes (Signed)
Pt  States Motrin isn't working and she is asking for IV or IM Dilaudid

## 2012-01-24 NOTE — ED Provider Notes (Signed)
History     CSN: [id number]  Arrival date & time 01/24/12  4098   First MD Initiated Contact with Patient 01/24/12 1056      Chief Complaint  Patient presents with  . Abdominal Pain    (Consider location/radiation/quality/duration/timing/severity/associated sxs/prior treatment) HPI  Patient states she has a history of endometriosis having lower abdominal pain that is exactly the same as previous episodes. The pain radiates to her low back. AShe is in the process of scheduling a hysterectomy with Dr. [name] and has an appointment on December 9. She takes Motrin at home for pain that is not helped. She's had previous visits for the same. No fever, dysuria, hematuria, vaginal or discharge.  The history is provided by the patient.  Patient currently menstruating, on bcp, negative pregnancy here on 12/3 Past Medical History  Diagnosis Date  . Endometriosis     No past surgical history on file.  Family History  Problem Relation Age of Onset  . Sudden death Father   . Heart attack Father   . Diabetes Neg Hx   . Hyperlipidemia Neg Hx   . Hypertension Neg Hx     History  Substance Use Topics  . Smoking status: Never Smoker   . Smokeless tobacco: Not on file  . Alcohol Use: No    OB History    Grav Para Term Preterm Abortions TAB SAB Ect Mult Living                  Review of Systems  Constitutional: Negative for fever, chills, activity change, appetite change and unexpected weight change.  HENT: Negative for sore throat, rhinorrhea, neck pain, neck stiffness and sinus pressure.   Eyes: Negative for visual disturbance.  Respiratory: Negative for cough and shortness of breath.   Cardiovascular: Negative for chest pain and leg swelling.  Gastrointestinal: Negative for vomiting, abdominal pain, diarrhea and blood in stool.  Genitourinary: Negative for dysuria, urgency, frequency, vaginal discharge and difficulty urinating.  Musculoskeletal: Negative for myalgias,  arthralgias and gait problem.  Skin: Negative for color change and rash.  Neurological: Negative for weakness, light-headedness and headaches.  Hematological: Does not bruise/bleed easily.  Psychiatric/Behavioral: Negative for dysphoric mood.    Allergies  Tylenol  Home Medications   Current Outpatient Rx  Name  Route  Sig  Dispense  Refill  . HYDROCODONE-ACETAMINOPHEN 5-500 MG PO TABS   Oral   Take 1-2 tablets by mouth every 6 (six) hours as needed for pain.   20 tablet   0   . IBUPROFEN 800 MG PO TABS      Take 1 tablet every 8 hours as needed for uterine cramping.   30 tablet   0   . IBUPROFEN 800 MG PO TABS   Oral   Take 1 tablet (800 mg total) by mouth 3 (three) times daily.   21 tablet   0   . NORGESTIM-ETH ESTRAD TRIPHASIC 0.18/0.215/0.25 MG-25 MCG PO TABS   Oral   Take 1 tablet by mouth daily.         Marland Kitchen ONDANSETRON 4 MG PO TBDP   Oral   Take 1 tablet (4 mg total) by mouth every 8 (eight) hours as needed for nausea.   20 tablet   0   . OXYCODONE-ACETAMINOPHEN 5-325 MG PO TABS   Oral   Take 2 tablets by mouth every 4 (four) hours as needed for pain.   15 tablet   0     BP 121/87  Pulse 75  Temp 98.2 F (36.8 C) (Oral)  Resp 16  SpO2 100%  LMP 12/22/2011  Physical Exam  Nursing note and vitals reviewed. Constitutional: She appears well-developed and well-nourished.  HENT:  Head: Normocephalic and atraumatic.  Eyes: Conjunctivae normal and EOM are normal. Pupils are equal, round, and reactive to light.  Neck: Normal range of motion. Neck supple.  Cardiovascular: Normal rate, regular rhythm, normal heart sounds and intact distal pulses.   Pulmonary/Chest: Effort normal and breath sounds normal.  Abdominal: Soft. Bowel sounds are normal.  Musculoskeletal: Normal range of motion.  Neurological: She is alert.  Skin: Skin is warm and dry.  Psychiatric: She has a normal mood and affect. Thought content normal.    ED Course  Procedures  (including critical care time)  Labs Reviewed - No data to display No results found.   No diagnosis found.    MDM  Pelvic pain history of endometriosis.  Patient with many rx for narcotics and advised to try other means for pain control.  Flexeril rx given.     Hilario Quarry, MD 01/26/12 [phone]

## 2012-01-24 NOTE — ED Notes (Signed)
Pt seen here this a.m. and given a muscle relaxer. Hx endometriosis. No better, so returning for something for same.

## 2012-01-24 NOTE — ED Notes (Signed)
Pt continues to have lower abdominal pain radiating to back due to endometriosis.  Pt to see MD on 01-26-12 for pre-op for hysterectomy.  Pt has been taking ibuprofen but is not helping the pain.  Pt states she is having worse pain progressively.  Some nausea.

## 2012-01-25 ENCOUNTER — Emergency Department (HOSPITAL_BASED_OUTPATIENT_CLINIC_OR_DEPARTMENT_OTHER)
Admission: EM | Admit: 2012-01-25 | Discharge: 2012-01-25 | Disposition: A | Discharge status: Home / Self Care | Payer: BC Managed Care – PPO | Attending: Emergency Medicine | Admitting: Emergency Medicine

## 2012-01-25 DIAGNOSIS — G8929 Other chronic pain: Secondary | ICD-10-CM

## 2012-01-25 MED ORDER — HYDROCODONE-ACETAMINOPHEN 5-325 MG PO TABS: 1.0000 | ORAL_TABLET | ORAL | Status: DC | PRN

## 2012-01-25 MED ORDER — ONDANSETRON HCL 4 MG/2ML IJ SOLN
INTRAMUSCULAR | Status: AC
  Administered 2012-01-25: 4 mg via INTRAVENOUS
  Filled 2012-01-25: qty 2

## 2012-01-25 MED ORDER — HYDROMORPHONE HCL PF 2 MG/ML IJ SOLN
1.0000 mg | Freq: Once | INTRAMUSCULAR | Status: AC
  Administered 2012-01-25: 1 mg via INTRAMUSCULAR
  Filled 2012-01-25: qty 1

## 2012-01-25 MED ORDER — KETOROLAC TROMETHAMINE 30 MG/ML IJ SOLN
60.0000 mg | Freq: Once | INTRAMUSCULAR | Status: AC
  Administered 2012-01-25: 30 mg via INTRAVENOUS
  Filled 2012-01-25: qty 1

## 2012-01-25 NOTE — ED Notes (Signed)
EDP in to see pt 

## 2012-01-25 NOTE — ED Notes (Signed)
Pt just medicated for pain with iv medication and will wait 30 min prior to discharge

## 2012-01-26 NOTE — ED Provider Notes (Signed)
History     CSN: [id number]  Arrival date & time 01/24/12  2201   First MD Initiated Contact with Patient 01/25/12 [phone]      Chief Complaint  Patient presents with  . Abdominal Pain    (Consider location/radiation/quality/duration/timing/severity/associated sxs/prior treatment) HPI 36 yo female with h/o endometriosis and chronic pain from same.  Pt was seen in the ER this morning, given rx for flexeril, and she reports it is not helping.  She is taking motrin intermittently which also is not helping.  She is seen by local GYN, has appointment on Monday to schedule hysterectomy.  Pt has had several visits to the ER for pain medications.  No fever, chills, no urinary symptoms.  No change tonight from her baseline pain Past Medical History  Diagnosis Date  . Endometriosis     History reviewed. No pertinent past surgical history.  Family History  Problem Relation Age of Onset  . Sudden death Father   . Heart attack Father   . Diabetes Neg Hx   . Hyperlipidemia Neg Hx   . Hypertension Neg Hx     History  Substance Use Topics  . Smoking status: Never Smoker   . Smokeless tobacco: Not on file  . Alcohol Use: No    OB History    Grav Para Term Preterm Abortions TAB SAB Ect Mult Living                  Review of Systems  All other systems reviewed and are negative.    Allergies  Tylenol  Home Medications   Current Outpatient Rx  Name  Route  Sig  Dispense  Refill  . CYCLOBENZAPRINE HCL 10 MG PO TABS   Oral   Take 1 tablet (10 mg total) by mouth 2 (two) times daily as needed for muscle spasms.   20 tablet   0   . HYDROCODONE-ACETAMINOPHEN 5-325 MG PO TABS   Oral   Take 1 tablet by mouth every 4 (four) hours as needed for pain.   6 tablet   0   . HYDROCODONE-ACETAMINOPHEN 5-500 MG PO TABS   Oral   Take 1-2 tablets by mouth every 6 (six) hours as needed for pain.   20 tablet   0   . IBUPROFEN 800 MG PO TABS      Take 1 tablet every 8 hours as needed  for uterine cramping.   30 tablet   0   . IBUPROFEN 800 MG PO TABS   Oral   Take 1 tablet (800 mg total) by mouth 3 (three) times daily.   21 tablet   0   . NORGESTIM-ETH ESTRAD TRIPHASIC 0.18/0.215/0.25 MG-25 MCG PO TABS   Oral   Take 1 tablet by mouth daily.         Marland Kitchen ONDANSETRON 4 MG PO TBDP   Oral   Take 1 tablet (4 mg total) by mouth every 8 (eight) hours as needed for nausea.   20 tablet   0   . OXYCODONE-ACETAMINOPHEN 5-325 MG PO TABS   Oral   Take 2 tablets by mouth every 4 (four) hours as needed for pain.   15 tablet   0     BP 131/82  Pulse 74  Temp 98.6 F (37 C) (Oral)  Resp 20  Ht 5\' 7"  (1.702 m)  Wt 206 lb (93.441 kg)  BMI 32.26 kg/m2  SpO2 100%  LMP 01/21/2012  Physical Exam  Nursing note and vitals  reviewed. Constitutional: She is oriented to person, place, and time. She appears well-developed and well-nourished.  HENT:  Head: Normocephalic and atraumatic.  Nose: Nose normal.  Mouth/Throat: Oropharynx is clear and moist.  Eyes: Conjunctivae normal and EOM are normal. Pupils are equal, round, and reactive to light.  Neck: Normal range of motion. Neck supple. No JVD present. No tracheal deviation present. No thyromegaly present.  Cardiovascular: Normal rate, regular rhythm, normal heart sounds and intact distal pulses.  Exam reveals no gallop and no friction rub.   No murmur heard. Pulmonary/Chest: Effort normal and breath sounds normal. No stridor. No respiratory distress. She has no wheezes. She has no rales. She exhibits no tenderness.  Abdominal: Soft. Bowel sounds are normal. She exhibits no distension and no mass. There is tenderness (suprapubic tenderness). There is no rebound and no guarding.  Musculoskeletal: Normal range of motion. She exhibits no edema and no tenderness.  Lymphadenopathy:    She has no cervical adenopathy.  Neurological: She is alert and oriented to person, place, and time. She exhibits normal muscle tone. Coordination  normal.  Skin: Skin is warm and dry. No rash noted. No erythema. No pallor.  Psychiatric: She has a normal mood and affect. Her behavior is normal. Judgment and thought content normal.    ED Course  Procedures (including critical care time)  Labs Reviewed - No data to display No results found.   1. Chronic pelvic pain in female       MDM  36 yo female with chronic pelvic pain.  I explained to patient that management of her chronic pain needs to be managed by her pcm or gyn.  She has had several ER visits for pain meds.  She was informed that we are unable to continue prescribing these meds.  I will prescribe 6 tabs for weekend use, and she should have her ob prescribe on Monday's visit        Olivia Mackie, MD 01/26/12 1247

## 2012-01-28 ENCOUNTER — Encounter (HOSPITAL_COMMUNITY): Payer: Self-pay | Admitting: Pharmacist

## 2012-02-03 ENCOUNTER — Encounter (HOSPITAL_COMMUNITY)
Admission: RE | Admit: 2012-02-03 | Discharge: 2012-02-03 | Disposition: A | Discharge status: Home / Self Care | Payer: BC Managed Care – PPO | Source: Ambulatory Visit | Attending: Obstetrics & Gynecology | Admitting: Obstetrics & Gynecology

## 2012-02-03 ENCOUNTER — Encounter (HOSPITAL_COMMUNITY): Payer: Self-pay

## 2012-02-03 HISTORY — DX: Other specified health status: Z78.9

## 2012-02-03 LAB — SURGICAL PCR SCREEN: MRSA, PCR: NEGATIVE

## 2012-02-03 LAB — CBC
HCT: 40.7 % (ref 36.0–46.0)
Hemoglobin: 13 g/dL (ref 12.0–15.0)
MCH: 29.3 pg (ref 26.0–34.0)
MCHC: 31.9 g/dL (ref 30.0–36.0)
MCV: 91.9 fL (ref 78.0–100.0)
RBC: 4.43 MIL/uL (ref 3.87–5.11)

## 2012-02-03 NOTE — Patient Instructions (Addendum)
Your procedure is scheduled on:02/05/12  Enter through the Main Entrance at :12 noon Pick up desk phone and dial [phone] and inform us of your arrival.  Please call [phone] if you have any problems the morning of surgery.  Remember: Do not eat after midnight:WED  Clear liquids ok until 0930 am Thursday Take these meds the morning of surgery with a sip of water:NONE  DO NOT wear jewelry, eye make-up, lipstick,body lotion, or dark fingernail polish. Do not shave for 48 hours prior to surgery.  If you are to be admitted after surgery, leave suitcase in car until your room has been assigned.

## 2012-02-04 ENCOUNTER — Other Ambulatory Visit: Payer: Self-pay | Admitting: Obstetrics & Gynecology

## 2012-02-04 NOTE — H&P (Addendum)
--------------------------------------------------------------------------------  [name], [name]  36 Y  old  Female, DOB: 05/07/1975  [address], [place], [place]-[address]  Home: [phone]      Guarantor: [name], [name]    Insurance: [employer] P[employer]  Referring: [name] [name] [name] [name]  Appointment Facility: [facility]   -------------------------------------------------------------------------------- 01/30/2012 [name] [name] [name]. [name] Standage, MD    Reason for Appointment  1. pre op surgery 02/05/2012 [facility]      History of Present Illness  here for:          36 year old female presents with c/o here for f/u of gyn problem as she has a [name]/o endometriosis and a small myoma but has significant pain that is interfering with her ADLs and is there throughout the month and is exacerbated with her menses and worse before and afterward. She states she is here to schedule hysterectomy with laparoscope and conserve ovaries..         c/o here for problem gyn visit for discussion of pelvic pain and dysmenorrhea and pain at other times as she has completed 3 pill packs of combo OCs and these had a regular mostly pain free menses and then the last two months the pain has been progressively worsening with the last month the menses came during the 3rd week and lasted 10 d. The pain initially began almost 2 years ago and the pain at other times of the month began 6 months ago. She was given all of her options to include continue current management with the potential for addiction discussed with the patient as the ibuprofen and the ultram did not work and to function she used the percocet #10 over the weekend, also the continuous OCs and the possibility of progesterone only, the use of lupron and the use of surgery with diagnostic laparoscopy and hysteroscopy D&C with ablation and also hysterectomy +/- SO..     Current Medications  Sprintec 28 0.25-35 MG-MCG Tablet 1 tablet Once a day  Vicodin 5-500 mg Tablet 1 po  q 6 hours   Medication List reviewed and reconciled with the patient        Past Medical History  Endometriosis      Surgical History  Denies Past Surgical History      Family History  Father: deceased heart attack 05/2011   Mother: alive   Paternal Grand Father: deceased   Paternal Grand Mother: deceased   Maternal Grand Father: deceased   Maternal Grand Mother: deceased colon/rectal CA   4 brother(s) , 3 sister(s) - healthy. 1 son(s) , 2 daughter(s) - healthy.       Social History  Tobacco Use:         Tobacco Use/Smoking  Are you a: nonsmoker .   Miscellaneous:         no Caffeine.        Seat belt use: Yes.        Children: yes.        no Community involvements.        no Domestic violence.        no Exercise.        Home smoke detector use: yes.        no Legal problems.        Marital status: married.        no Natural support system.        New since last visit: none.        Occupation: weeks/months/years, works full-time.          Occupational exposure: none.        Others at home: none.        Pets: cats: 1 dogs:3.        no Sexual abuse.        no Sexually active.        no Travel outside of the [place].        no Verbal abuse.     Gyn History  Periods :  every 10 days, bc didnt help, extremely heavy menses .   Sexual activity  currently sexually active, , with men.   Last pap smear date  11/06/2011 neg/HPV neg.   Last mammogram date  10/2011 benign.   Abnormal pap smear  no.   Date of Last Period  12/24/2011.   Sexually Transmitted Diseases (STDs)  none.   Birth control  sprintec.      OB History  Total pregnancies  3.   Total living children  3.   Pregnancy # 1:  normal spontaneous vaginal delivery (NSVD), no complications, girl, 39 weeks.   Pregnancy # 2:  normal spontaneous vaginal delivery (NSVD), no complications, boy, 40 weeks.   Pregnancy # 3  normal spontaneous vaginal delivery (NSVD), no complications, girl, 40 weeks .       Allergies  Tylenol: hives, vomiting: Allergy      Hospitalization/Major Diagnostic Procedure  child birth X 3       Review of Systems  General/Constitutional:          Denies Change in appetite.  Denies Chills.  Denies Fatigue.  Denies Fever.  Denies Headache.  Denies Lightheadedness.  Denies Sleep disturbance.  Denies Weight gain.  Denies Weight loss.      Allergy/Immunology:          Denies Blistering of skin.  Denies Congestion.  Denies Cough.  Denies Hives.  Denies Itching.  Denies Rash.  Denies Sneezing.  Denies Watery eyes.  Denies Wheezing.      Ophthalmologic:          Denies Blurred vision.  Denies Diminished visual acuity.  Denies Discharge.  Denies Dry eye.  Denies Flashes of light in the visual field.  Denies Floaters in the visual field.  Denies Itching and redness.  Denies Pain.  Denies Red eye.  Denies Vision screen.      ENT:          Denies Blocked ear.  Denies Decreased hearing.  Denies Decreased sense of smell.  Denies Difficulty swallowing.  Denies Dry mouth.  Denies Ear pain.  Denies Hearing screen.  Denies Nosebleed.  Denies Ringing in the ears.  Denies Sinus pain.  Denies Sore throat.  Denies Swollen glands.      Endocrine:          Denies Cold intolerance.  Denies Difficulty sleeping.  Denies Dizziness.  Denies Excessive sweating.  Denies Excessive thirst.  Denies Frequent urination.  Denies Heat intolerance.  Denies Irregular menses.  Denies Weakness.  Denies Weight loss.      Respiratory:          Denies Breathing pattern.  Denies Chest pain.  Denies Cough.  Denies Hemoptysis.  Denies Pain with inspiration.  Denies Shortness of breath at rest.  Denies Shortness of breath with exertion.  Denies Sputum production.  Denies Wheezing.      Breast:          Denies Bloody nipple discharge.  Denies Breast lump.  Denies Breast pain.  Denies Breast swelling.    Denies Fever.  Denies Gland swelling.  Denies Nipple discharge.  Denies Red skin.  Denies Weight loss.       Cardiovascular:          Denies Chest pain at rest.  Denies Chest pain with exertion.  Denies Claudication.  Denies Cyanosis.  Denies Difficulty laying flat.  Denies Dizziness.  Denies Dyspnea on exertion.  Denies Fluid accumulation in the legs.  Denies Irregular heartbeat.  Denies Orthopnea.  Denies Palpitations.  Denies Shortness of breath.  Denies Weakness.  Denies Weight gain.      Gastrointestinal:          Admits Abdominal pain, admits, constant, left lower quadrant, right lower quadrant, lower abdomen, that is severe, sharp , radiating to back, radiating to groin.  Denies Blood in stool.  Denies Change in bowel habits.  Denies Constipation.  Denies Decreased appetite.  Denies Diarrhea.  Denies Difficulty swallowing.  Denies Exposure to hepatitis.  Denies Heartburn.  Denies Hematemesis.  Denies Nausea.  Denies Rectal bleeding.  Denies Vomiting.  Denies Weight loss.      Women Only:          Denies Breast lump.  Denies Breast pain.  Denies Discharge from the breast.  Admits Heavy bleeding during menses, admits.  Denies Hot flashes.  Admits Irregular menses, admits, associated with cramping, longer periods, more frequent periods.  Denies Missed periods.  Denies Painful intercourse.  Admits Painful menses, admits.  Admits Vaginal bleeding between periods, admits.  Denies Vaginal discharge/itching.      Genitourinary:          Admits Abdominal pain/swelling, admits.  Denies Blood in urine.  Denies Difficulty urinating.  Denies Frequent urination.  Admits Pain in lower back, admits.  Denies Painful urination.          Vital Signs  HR 70 /min, BP 120/58 mm Hg, Ht 67 in, Wt 211 lbs, BMI 33.04 Index, RR 16 /min, Ht-cm 170.18 cm, Wt-kg 95.71 kg.    Examination  General Examination:        GENERAL APPEARANCE: in no acute distress, well developed, well nourished.          HEAD: normocephalic, atraumatic.          EYES: pupils equal, round, reactive to light.          EARS: normal.          ORAL  CAVITY: mucosa moist.          THROAT: clear.          NECK/THYROID: neck supple, full range of motion, no cervical lymphadenopathy.          LYMPH NODES: normal, no axillary, supraclavicular or inguinal adenopathy.          SKIN: no suspicious lesions, warm and dry.          HEART: no murmurs, regular rate and rhythm, S1, S2 normal.          LUNGS: clear to auscultation bilaterally.          ABDOMEN: normal, bowel sounds present, soft, nontender, nondistended.          BACK: no costovertebral angle tenderness.          EXTREMITIES: no clubbing, cyanosis, or edema.          NEUROLOGIC: nonfocal, motor strength normal upper and lower extremities, sensory exam intact.       surgery consenting:  surgery counseling Surgery counseling: The patient is aware that the risks   specific to the procedure are that there is a potential for bleeding and the possibility of a transfusion. She realizes that this procedure will make her sterile. There is a potential for damage to other intra-abdominal organs to include ovaries, tubes, bladder, ureters, and bowel, sometimes recognized at the time of surgery and repaired at that time with a potential for a larger incision and/or a second surgeon.   The patient is also aware of the fact that these may not be discovered until later date and that she would need to let us know about any postoperative complications by calling me, the office or proceeding to the emergency room. If these complications are not found at the time of surgery and are discovered later date they may need surgical repair at that time.   The patient is also aware the potential for infection and the potential for prolonged hospital stay and the potential for antibiotics on an inpatient and/or outpatient basis.   Patient is aware of the potential for treating her pain and being free of pain, but the pain may not be cured with this surgery and the pain may may be worsened or new after the surgery.  This could be because of scar tissue or the result of healing post procedure or the result that the pain is related to another potential etiology. She understands the pelvic pain is complex with the potential for gynecological, musculoskeletal, neurological, urological and gastrointestinal etiologies, however, we think that the pain is related to the pathology that is gynecological and is the results of her endometriosis. She understands the above and wishes to proceed with the procedure .   The patient is also aware that we will attempt of procedure with minimally invasive technique. This is a laparoscopic hysterectomy. However, the patient also understands that if for some reason there is a judgment that would occur during the procedure to convert this to a open procedure or a laparotomy this would have to be done.    She is not having her ovaries removed but we did briefly discuss ERT : If one or both ovaries happen to be removed secondary to safety with regard to bleeding or appearance concerns this could potentially make the patient menopausal. She understands this will be a naturally occurring phenomena and with the average age of 36 years old for menopause. She also understands that we will have to deal with it potential symptoms of menopause. She is aware that that potential could be estrogen replacement therapy she has now been made aware of the increased risk of clotting and stroke with the use of estrogen therapy. She is aware of the fact that this may take several months after her surgery to get her hormonal balance where she is satisfied. The patient understands them wishes to proceed.   The patient was counseled regarding the risks, benefits, and alternatives of estrogen replacement therapy. The risks include, but not completely limited to the increased risk of deep vein thrombosis, pulmonary embolus, and stroke. These risks are thought to be mitigated by a baby aspirin per day, which is  recommended to this patient and also the use of topical estrogen. The benefits clearly are for the prevention and/or treatment of osteoporosis, osteopenia and bone loss. Other benefits include decreased climacteric/menopausal symptoms of hot flashes, night sweats, mood, lability, insomnia, and vaginal dryness. Alternatively, there is no life threatening reason to start estrogen therapy. Certainly there are medications to treat osteopenia and osteoporosis and there are bone building exercises of   weightbearing and supplemental calcium and vitamin D to help decrease bone loss. There are also holistic and over-the-counter medicines that can mitigate the symptoms of menopause. Certainly, vaginal dryness can be treated with topical estrogen and decrease the risks of systemic estrogen. Breast tenderness can result as well as abnormal or postmenopausal bleeding. Certainly the use of progesterone can prevent potential for endometrial cancer. Since the patient will not have a uterus both estrogen and progesterone would not have to be used. Certainly the goal is to find the lowest effective dose to satisfy symptoms and maintain bone density. This can be accomplished over the course of several months or years with the goal being to individualize the therapy   Transfusion counseling: The patient is aware of the possibility with this procedure of the use of blood replacement products in the form of a transfusion. The patient is aware of the risks benefits and alternatives of these. She is aware that we would only use these on an emergency basis or sometimes if considered to significantly reduce the risk of transfusion requirement during surgery. The patient is aware of the risk of the each unit of transfusion having a risk of HIV being approximately 1 in 1 million and having a risk of hepatitis being 1 in 2000, with sometimes the development of chronic active hepatitis bleeding to the potential for liver failure and liver  transplant, and having a low substantial risk of transfusion reaction were her body would reject the replacement when we thought she needed it.         Assessments  1. Endometriosis of other specified sites - 617.8 (Primary)  2. Excessive or frequent menstruation - 626.2  3. Body Mass Index 33.0-33.9, adult - V85.33  4. Other chronic pain - 338.29  5. Dysmenorrhea - 625.3  6. Leiomyoma of uterus, unspecified - 218.9    Treatment  1. Endometriosis of other specified sites   will proceed with [facility] and conserve the ovaries preop mag citrate SCDs empiric antibiotics for SCIP.             Procedure Codes  Preop Pre-op Visit No charge      Follow Up  2 Week postop            Electronically signed by Philander Ake , MD on 02/04/2012 at 02:11 PM EST  Sign off status: Pending     --------------------------------------------------------------------------------  [facility] [address] [place], [place] [id number] Tel: [phone] Fax: [phone]         --------------------------------------------------------------------------------   Patient: [name], [name]    DOB: 12/24/1975     Progress Note: Tian Davison [name]. Perfecto Purdy, MD    01/30/2012      

## 2012-02-05 ENCOUNTER — Encounter (HOSPITAL_COMMUNITY): Payer: Self-pay | Admitting: Anesthesiology

## 2012-02-05 ENCOUNTER — Ambulatory Visit (HOSPITAL_COMMUNITY): Payer: BC Managed Care – PPO | Admitting: Anesthesiology

## 2012-02-05 ENCOUNTER — Observation Stay (HOSPITAL_COMMUNITY)
Admission: RE | Admit: 2012-02-05 | Discharge: 2012-02-06 | Disposition: A | Discharge status: Home / Self Care | Payer: BC Managed Care – PPO | Source: Ambulatory Visit | Attending: Obstetrics & Gynecology | Admitting: Obstetrics & Gynecology

## 2012-02-05 ENCOUNTER — Encounter (HOSPITAL_COMMUNITY)
Admission: RE | Disposition: A | Discharge status: Home / Self Care | Payer: Self-pay | Source: Ambulatory Visit | Attending: Obstetrics & Gynecology

## 2012-02-05 DIAGNOSIS — D259 Leiomyoma of uterus, unspecified: Secondary | ICD-10-CM | POA: Insufficient documentation

## 2012-02-05 DIAGNOSIS — N803 Endometriosis of pelvic peritoneum, unspecified: Principal | ICD-10-CM | POA: Insufficient documentation

## 2012-02-05 DIAGNOSIS — N946 Dysmenorrhea, unspecified: Secondary | ICD-10-CM | POA: Insufficient documentation

## 2012-02-05 DIAGNOSIS — N949 Unspecified condition associated with female genital organs and menstrual cycle: Secondary | ICD-10-CM | POA: Insufficient documentation

## 2012-02-05 DIAGNOSIS — Z01812 Encounter for preprocedural laboratory examination: Secondary | ICD-10-CM | POA: Insufficient documentation

## 2012-02-05 DIAGNOSIS — Z90710 Acquired absence of both cervix and uterus: Secondary | ICD-10-CM

## 2012-02-05 DIAGNOSIS — N938 Other specified abnormal uterine and vaginal bleeding: Secondary | ICD-10-CM | POA: Insufficient documentation

## 2012-02-05 DIAGNOSIS — Z01818 Encounter for other preprocedural examination: Secondary | ICD-10-CM | POA: Insufficient documentation

## 2012-02-05 HISTORY — PX: LAPAROSCOPIC HYSTERECTOMY: SHX1926

## 2012-02-05 HISTORY — PX: CYSTOSCOPY: SHX5120

## 2012-02-05 SURGERY — HYSTERECTOMY, TOTAL, LAPAROSCOPIC
Anesthesia: General | Site: Bladder | Wound class: Clean Contaminated

## 2012-02-05 MED ORDER — DEXAMETHASONE SODIUM PHOSPHATE 4 MG/ML IJ SOLN
INTRAMUSCULAR | Status: DC | PRN
  Administered 2012-02-05: 10 mg via INTRAVENOUS

## 2012-02-05 MED ORDER — INDIGOTINDISULFONATE SODIUM 8 MG/ML IJ SOLN
INTRAMUSCULAR | Status: AC
  Filled 2012-02-05: qty 5

## 2012-02-05 MED ORDER — DEXAMETHASONE SODIUM PHOSPHATE 10 MG/ML IJ SOLN
INTRAMUSCULAR | Status: AC
  Filled 2012-02-05: qty 1

## 2012-02-05 MED ORDER — CEFAZOLIN SODIUM-DEXTROSE 2-3 GM-% IV SOLR
2.0000 g | INTRAVENOUS | Status: AC
  Administered 2012-02-05: 2 g via INTRAVENOUS

## 2012-02-05 MED ORDER — BUPIVACAINE HCL (PF) 0.5 % IJ SOLN
INTRAMUSCULAR | Status: AC
  Filled 2012-02-05: qty 30

## 2012-02-05 MED ORDER — LIDOCAINE HCL (CARDIAC) 20 MG/ML IV SOLN
INTRAVENOUS | Status: DC | PRN
  Administered 2012-02-05: 50 mg via INTRAVENOUS

## 2012-02-05 MED ORDER — HYDROMORPHONE HCL PF 1 MG/ML IJ SOLN
INTRAMUSCULAR | Status: DC | PRN
  Administered 2012-02-05 (×2): 1 mg via INTRAVENOUS

## 2012-02-05 MED ORDER — NEOSTIGMINE METHYLSULFATE 1 MG/ML IJ SOLN
INTRAMUSCULAR | Status: AC
  Filled 2012-02-05: qty 1

## 2012-02-05 MED ORDER — ZOLPIDEM TARTRATE 5 MG PO TABS: 5.0000 mg | ORAL_TABLET | Freq: Every evening | ORAL | Status: DC | PRN

## 2012-02-05 MED ORDER — GLYCOPYRROLATE 0.2 MG/ML IJ SOLN
INTRAMUSCULAR | Status: DC | PRN
  Administered 2012-02-05: 0.4 mg via INTRAVENOUS

## 2012-02-05 MED ORDER — MIDAZOLAM HCL 2 MG/2ML IJ SOLN: 0.5000 mg | Freq: Once | INTRAMUSCULAR | Status: DC | PRN

## 2012-02-05 MED ORDER — SUCCINYLCHOLINE CHLORIDE 20 MG/ML IJ SOLN
INTRAMUSCULAR | Status: DC | PRN
  Administered 2012-02-05: 5 mg via INTRAVENOUS
  Administered 2012-02-05: 35 mg via INTRAVENOUS

## 2012-02-05 MED ORDER — ONDANSETRON HCL 4 MG/2ML IJ SOLN
INTRAMUSCULAR | Status: AC
  Filled 2012-02-05: qty 2

## 2012-02-05 MED ORDER — ONDANSETRON HCL 4 MG PO TABS: 4.0000 mg | ORAL_TABLET | Freq: Four times a day (QID) | ORAL | Status: DC | PRN

## 2012-02-05 MED ORDER — MIDAZOLAM HCL 2 MG/2ML IJ SOLN
INTRAMUSCULAR | Status: AC
  Filled 2012-02-05: qty 2

## 2012-02-05 MED ORDER — PROPOFOL 10 MG/ML IV EMUL
INTRAVENOUS | Status: AC
  Filled 2012-02-05: qty 20

## 2012-02-05 MED ORDER — LACTATED RINGERS IR SOLN
Status: DC | PRN
  Administered 2012-02-05: 1

## 2012-02-05 MED ORDER — HYDROMORPHONE HCL PF 1 MG/ML IJ SOLN
INTRAMUSCULAR | Status: AC
  Filled 2012-02-05: qty 1

## 2012-02-05 MED ORDER — LACTATED RINGERS IV SOLN
INTRAVENOUS | Status: DC
  Administered 2012-02-05 (×4): via INTRAVENOUS

## 2012-02-05 MED ORDER — FENTANYL CITRATE 0.05 MG/ML IJ SOLN
INTRAMUSCULAR | Status: AC
  Administered 2012-02-05: 50 ug via INTRAVENOUS
  Filled 2012-02-05: qty 2

## 2012-02-05 MED ORDER — KETOROLAC TROMETHAMINE 30 MG/ML IJ SOLN: 30.0000 mg | Freq: Four times a day (QID) | INTRAMUSCULAR | Status: DC

## 2012-02-05 MED ORDER — MIDAZOLAM HCL 5 MG/5ML IJ SOLN
INTRAMUSCULAR | Status: DC | PRN
  Administered 2012-02-05: 2 mg via INTRAVENOUS

## 2012-02-05 MED ORDER — ROCURONIUM BROMIDE 100 MG/10ML IV SOLN
INTRAVENOUS | Status: DC | PRN
  Administered 2012-02-05: 10 mg via INTRAVENOUS
  Administered 2012-02-05: 35 mg via INTRAVENOUS
  Administered 2012-02-05: 5 mg via INTRAVENOUS
  Administered 2012-02-05: 10 mg via INTRAVENOUS

## 2012-02-05 MED ORDER — CEFAZOLIN SODIUM-DEXTROSE 2-3 GM-% IV SOLR
INTRAVENOUS | Status: AC
  Filled 2012-02-05: qty 50

## 2012-02-05 MED ORDER — GLYCOPYRROLATE 0.2 MG/ML IJ SOLN
INTRAMUSCULAR | Status: AC
  Filled 2012-02-05: qty 1

## 2012-02-05 MED ORDER — FENTANYL CITRATE 0.05 MG/ML IJ SOLN
25.0000 ug | INTRAMUSCULAR | Status: DC | PRN
  Administered 2012-02-05 (×4): 50 ug via INTRAVENOUS

## 2012-02-05 MED ORDER — PROPOFOL 10 MG/ML IV EMUL
INTRAVENOUS | Status: DC | PRN
  Administered 2012-02-05: 200 mg via INTRAVENOUS

## 2012-02-05 MED ORDER — LIDOCAINE HCL (CARDIAC) 20 MG/ML IV SOLN
INTRAVENOUS | Status: AC
Start: 2012-02-05 — End: 2012-02-05
  Filled 2012-02-05: qty 5

## 2012-02-05 MED ORDER — KETOROLAC TROMETHAMINE 30 MG/ML IJ SOLN: 15.0000 mg | Freq: Once | INTRAMUSCULAR | Status: DC | PRN

## 2012-02-05 MED ORDER — FENTANYL CITRATE 0.05 MG/ML IJ SOLN
INTRAMUSCULAR | Status: DC | PRN
  Administered 2012-02-05: 100 ug via INTRAVENOUS
  Administered 2012-02-05 (×2): 50 ug via INTRAVENOUS
  Administered 2012-02-05 (×2): 100 ug via INTRAVENOUS
  Administered 2012-02-05 (×2): 50 ug via INTRAVENOUS

## 2012-02-05 MED ORDER — ONDANSETRON HCL 4 MG/2ML IJ SOLN: 4.0000 mg | Freq: Four times a day (QID) | INTRAMUSCULAR | Status: DC | PRN

## 2012-02-05 MED ORDER — SODIUM CHLORIDE 0.45 % IV SOLN
INTRAVENOUS | Status: DC
  Administered 2012-02-05 – 2012-02-06 (×2): via INTRAVENOUS
  Filled 2012-02-05 (×3): qty 1000

## 2012-02-05 MED ORDER — NEOSTIGMINE METHYLSULFATE 1 MG/ML IJ SOLN
INTRAMUSCULAR | Status: DC | PRN
  Administered 2012-02-05: 2 mg via INTRAVENOUS

## 2012-02-05 MED ORDER — PROMETHAZINE HCL 25 MG/ML IJ SOLN: 6.2500 mg | INTRAMUSCULAR | Status: DC | PRN

## 2012-02-05 MED ORDER — OXYCODONE-ACETAMINOPHEN 5-325 MG PO TABS
1.0000 | ORAL_TABLET | ORAL | Status: DC | PRN
  Administered 2012-02-05 – 2012-02-06 (×4): 2 via ORAL
  Filled 2012-02-05 (×4): qty 2

## 2012-02-05 MED ORDER — SCOPOLAMINE 1 MG/3DAYS TD PT72
MEDICATED_PATCH | TRANSDERMAL | Status: AC
  Administered 2012-02-05: 1 via TRANSDERMAL
  Filled 2012-02-05: qty 1

## 2012-02-05 MED ORDER — MEPERIDINE HCL 25 MG/ML IJ SOLN: 6.2500 mg | INTRAMUSCULAR | Status: DC | PRN

## 2012-02-05 MED ORDER — KETOROLAC TROMETHAMINE 30 MG/ML IJ SOLN
30.0000 mg | Freq: Four times a day (QID) | INTRAMUSCULAR | Status: DC
  Administered 2012-02-05 – 2012-02-06 (×2): 30 mg via INTRAVENOUS
  Filled 2012-02-05: qty 1

## 2012-02-05 MED ORDER — INDIGOTINDISULFONATE SODIUM 8 MG/ML IJ SOLN
INTRAMUSCULAR | Status: DC | PRN
  Administered 2012-02-05: 5 mL via INTRAVENOUS

## 2012-02-05 MED ORDER — ONDANSETRON HCL 4 MG/2ML IJ SOLN
INTRAMUSCULAR | Status: DC | PRN
  Administered 2012-02-05: 4 mg via INTRAVENOUS

## 2012-02-05 MED ORDER — BUPIVACAINE HCL (PF) 0.5 % IJ SOLN
INTRAMUSCULAR | Status: DC | PRN
  Administered 2012-02-05: 30 mL

## 2012-02-05 MED ORDER — FENTANYL CITRATE 0.05 MG/ML IJ SOLN
INTRAMUSCULAR | Status: AC
Start: 2012-02-05 — End: 2012-02-05
  Filled 2012-02-05: qty 5

## 2012-02-05 MED ORDER — BUTORPHANOL TARTRATE 1 MG/ML IJ SOLN: 1.0000 mg | INTRAMUSCULAR | Status: DC | PRN

## 2012-02-05 MED ORDER — IBUPROFEN 600 MG PO TABS
600.0000 mg | ORAL_TABLET | Freq: Four times a day (QID) | ORAL | Status: DC | PRN
  Administered 2012-02-06: 600 mg via ORAL
  Filled 2012-02-05: qty 1

## 2012-02-05 MED ORDER — SIMETHICONE 80 MG PO CHEW: 80.0000 mg | CHEWABLE_TABLET | Freq: Four times a day (QID) | ORAL | Status: DC | PRN

## 2012-02-05 MED ORDER — POTASSIUM CHLORIDE IN NACL 20-0.45 MEQ/L-% IV SOLN: INTRAVENOUS | Status: DC

## 2012-02-05 SURGICAL SUPPLY — 48 items
BARRIER ADHS 3X4 INTERCEED (GAUZE/BANDAGES/DRESSINGS) ×3 IMPLANT
BENZOIN TINCTURE PRP APPL 2/3 (GAUZE/BANDAGES/DRESSINGS) ×3 IMPLANT
CABLE HIGH FREQUENCY MONO STRZ (ELECTRODE) ×3 IMPLANT
CHLORAPREP W/TINT 26ML (MISCELLANEOUS) ×3 IMPLANT
CLIP SUT LAPRA TY ABSORB (SUTURE) ×6 IMPLANT
CLOTH BEACON ORANGE TIMEOUT ST (SAFETY) ×3 IMPLANT
CONT PATH 16OZ SNAP LID 3702 (MISCELLANEOUS) ×3 IMPLANT
COVER LIGHT HANDLE  1/PK (MISCELLANEOUS) ×1
COVER LIGHT HANDLE 1/PK (MISCELLANEOUS) ×2 IMPLANT
DERMABOND ADVANCED (GAUZE/BANDAGES/DRESSINGS) ×1
DERMABOND ADVANCED .7 DNX12 (GAUZE/BANDAGES/DRESSINGS) ×2 IMPLANT
ELECT REM PT RETURN 9FT ADLT (ELECTROSURGICAL) ×3
ELECTRODE REM PT RTRN 9FT ADLT (ELECTROSURGICAL) ×2 IMPLANT
FILTER SMOKE EVAC LAPAROSHD (FILTER) ×6 IMPLANT
GLOVE BIO SURGEON STRL SZ7 (GLOVE) ×3 IMPLANT
GLOVE BIO SURGEON STRL SZ8 (GLOVE) ×3 IMPLANT
GLOVE BIOGEL PI IND STRL 7.0 (GLOVE) ×2 IMPLANT
GLOVE BIOGEL PI IND STRL 8 (GLOVE) ×6 IMPLANT
GLOVE BIOGEL PI IND STRL 8.5 (GLOVE) ×2 IMPLANT
GLOVE BIOGEL PI INDICATOR 7.0 (GLOVE) ×1
GLOVE BIOGEL PI INDICATOR 8 (GLOVE) ×3
GLOVE BIOGEL PI INDICATOR 8.5 (GLOVE) ×1
GLOVE SURG SS PI 6.5 STRL IVOR (GLOVE) ×3 IMPLANT
GOWN PREVENTION PLUS LG XLONG (DISPOSABLE) ×3 IMPLANT
GOWN PREVENTION PLUS XXLARGE (GOWN DISPOSABLE) ×6 IMPLANT
NS IRRIG 1000ML POUR BTL (IV SOLUTION) ×3 IMPLANT
OCCLUDER COLPOPNEUMO (BALLOONS) ×3 IMPLANT
PACK LAVH (CUSTOM PROCEDURE TRAY) ×3 IMPLANT
PROTECTOR NERVE ULNAR (MISCELLANEOUS) ×3 IMPLANT
SEALER TISSUE G2 CVD JAW 35 (ENDOMECHANICALS) ×2 IMPLANT
SEALER TISSUE G2 CVD JAW 45CM (ENDOMECHANICALS) ×1
SEALER TISSUE G2 STRG ARTC 35C (ENDOMECHANICALS) ×3 IMPLANT
SET CYSTO W/LG BORE CLAMP LF (SET/KITS/TRAYS/PACK) ×3 IMPLANT
SET IRRIG TUBING LAPAROSCOPIC (IRRIGATION / IRRIGATOR) IMPLANT
SLEEVE SURGEON STRL (DRAPES) ×3 IMPLANT
STRIP CLOSURE SKIN 1/4X4 (GAUZE/BANDAGES/DRESSINGS) ×3 IMPLANT
SUT MNCRL AB 3-0 PS2 27 (SUTURE) ×3 IMPLANT
SUT VIC AB 2-0 CT1 27 (SUTURE) ×1
SUT VIC AB 2-0 CT1 TAPERPNT 27 (SUTURE) ×2 IMPLANT
SUT VICRYL 0 UR6 27IN ABS (SUTURE) ×6 IMPLANT
SUT VICRYL 4-0 PS2 18IN ABS (SUTURE) ×3 IMPLANT
SYR 50ML LL SCALE MARK (SYRINGE) ×3 IMPLANT
TIP UTERINE 6.7X8CM BLUE DISP (MISCELLANEOUS) ×3 IMPLANT
TOWEL OR 17X24 6PK STRL BLUE (TOWEL DISPOSABLE) ×9 IMPLANT
TRAY FOLEY CATH 14FR (SET/KITS/TRAYS/PACK) ×3 IMPLANT
TROCAR BALLN 12MMX100 BLUNT (TROCAR) ×3 IMPLANT
TROCAR XCEL NON-BLD 11X100MML (ENDOMECHANICALS) ×6 IMPLANT
WARMER LAPAROSCOPE (MISCELLANEOUS) ×3 IMPLANT

## 2012-02-05 NOTE — Transfer of Care (Signed)
Immediate Anesthesia Transfer of Care Note  Patient: [name] [name]  Procedure(s) Performed: Procedure(s) (LRB) with comments: HYSTERECTOMY TOTAL LAPAROSCOPIC (N/A) CYSTOSCOPY (N/A)  Patient Location: PACU  Anesthesia Type:General  Level of Consciousness: awake  Airway & Oxygen Therapy: Patient Spontanous Breathing and Patient connected to nasal cannula oxygen  Post-op Assessment: Report given to PACU RN and Post -op Vital signs reviewed and stable  Post vital signs: stable  Complications: No apparent anesthesia complications

## 2012-02-05 NOTE — Op Note (Addendum)
02/05/2012  5:54 PM  PATIENT:  [name] [name]  36 y.o. female  PRE-OPERATIVE DIAGNOSIS:  Endometriosis; Fibroids  POST-OPERATIVE DIAGNOSIS:  Endometriosis; Fibroids  PROCEDURE:  Procedure(s): HYSTERECTOMY TOTAL LAPAROSCOPIC CYSTOSCOPY BILATERAL SALPINGECTOMY  SURGEON:  Surgeon(s): Delbert Harness, MD Fortino Sic, MD  PHYSICIAN ASSISTANT:   ASSISTANTS: none   ANESTHESIA:   general  EBL:  Total I/O In: 2000 [I.V.:2000] Out: 450 [Urine:250; Blood:200]  BLOOD ADMINISTERED:none  DRAINS: Urinary Catheter (Foley)   LOCAL MEDICATIONS USED:  MARCAINE     SPECIMEN:  Source of Specimen:  uterus with cervix and both tubes with stitch in the right tube  DISPOSITION OF SPECIMEN:  PATHOLOGY  COUNTS:  YES  TOURNIQUET:  * No tourniquets in log *  DICTATION: .Note written in EPIC  PLAN OF CARE: Admit for overnight observation  PATIENT DISPOSITION:  PACU - hemodynamically stable.   Delay start of Pharmacological VTE agent (>24hrs) due to surgical blood loss or risk of bleeding:mechanical prophylaxis - no delay      INDICATIONS:  36 yo G3P3 with long h/o endometriosis and pain and menorrhagia and desires definitive treatment with conservation of ovaries and removal of both tubes.     FINDINGS:  The upper abdomen was normal. The right tube was within normal limits. The left tube was within normal limits.  The uterus appeared to be WNL. The appendix was wnl. The right ovary was within normal limits.  The left ovary was within normal limits.  The anterior CDS was within normal limits. The posterior CDS was abnormal with stellate scarring and [employer] but no conspicous endometriosis otherwise.  No frozen section obtained.  Both inguinal rings were within normal limits without gross hernia. Good hemostasis was noted at maximum of 4 mm Hg pressure during the closure of the laparoscopy.  The vaginal cuff was sealed with good occlusion of  pneumoperitoneum.  Good fascial integrity noted post procedure at the umbilical site after the fascia was closed. The side ports were not required to be closed.  The cystoscopy revealed good bladder mucosa integrity and good bilateral ureteral efflux of blue indigo carmine dye.     Description of procedure. The patient was identified in the preoperative area. Consent was signed. The plan was revisited and confirmed.  The patient was moved to the operating room she was placed OR table in the supine position and the patient was then placed in the modified lithotomy position with the use of bucket stirrups and then underwent general anesthetic induction. . The patient was prepped and draped in usual sterile fashion. Active time out was performed to match patient with procedure to confirm that the antibiotics have been given. A Foley catheter was inserted. Patient did have SCDs on at the time. There was no plan for blood products.   A single-tooth tenaculum was attached to the anterior lip of cervix and and the uterus sounded to 9 cm and a size 8RUMI was attached to a 3.0 (medium) cm cup.. The cervix was then brought to the cup and the RUMI placed without difficulty and apparently with a good fit.  Gloves were exchanged and the small 2 cm vertical skin incision was made in the infraumbilical region. The Hasson technique was utilized and 2 x  0 Vicryl stay sutures were placed bilaterally. Once intraabdominal placement was confirmed by visualization.  The pneumoperitoneum was created with CO2 gas and the pressure was no greater than 15 mm mercury noted throughout the entire procedure.  At this point the XL bladeless trocar was then inserted. The local anesthetic was injected after each incision was made for the three ports. The second and third ports were placed just superior to McBurney's on each side. The 1.5 cm incisions were made with the knife blade. The side ports were placed under direct visualization  and with transillumination. The bladeless trocar ports were used. Care was taken noted to not injure intra-abdominal organs or intraabdominal wall vessels. These were 06/27/10 mm trocar bladeless Excel trocar ports.    The above findings were noted. Instruments that were utilized was  A grasper - alligator type, the Enseal device, and monopolar scissors and the laparaty device and self righting needle driver and interceed. Nezhat suction irrigation was used as well   The course of the left ureter was traced prior to application of the energy. Care was taken to place patient in Trendelenburg and to remove the bowel from any close proximity to the operative field..At this point good hemostasis was noted. The left uteroovarian ligament was divided and then the broad ligament through the round ligament. The left round ligament was secured and divided. The anterior leaf of the broad ligament was secured. The bladder flap was developed.  Back bleeding secured with Enseal sealing only.   The uterine vessels were skeletonized and divided.  The peritoneum divided posteriorly.  The Enseal device was once again utilized on the contralateral side in a similar fashion through the uteroovarian and through the broad ligament and round ligament and then the two leaves of the broad ligament were divided.  I switched sides and the uterine vessels were skeletonized as the bladder flap was finished and the posterior cervix peritoneum was divided.  The uterine vessels were secured.  The initiation of the colpotoomy was performed at this time and once in the midline I switched sides again. The colpotomy was completed with the monopolar scissors.   During colpotomy care was taken to make sure the bowel was not involved and the colpotomy made within the cup.   The occluder was previously perforated.  The uterus had to be removed from the abdomen and the uterus was used as an occluder.  There was significant difficulty in  maintaining the pneumoperitoneum.  Finally the uterus was removed from the vagina after the cuff was closed.  The cuff was closed with laparaties on 2-0 Vicryl on a CT-1. The cuff angles were closed and the uterosacral was included.   laparaties were used for this closure.   The max pressure was reduced to 4 mm Hg. Good hemostasis was noted.    The enseal device was used to remove both fallopian tubes  Ports were removed under direct visualization that the umbilical port was then removed after the pneumoperitoneum was relieved. The port at the umbilicus was then closed with the previously placed stay suture of 0 vicryl for the fascia. The skin at all sites with was closed with 4-0 monocryl in a subcuticular interrupted fashion.    The instruments were removed from the vagina and the Foley was removed from the bladder. As the cystoscopy was performed with a 30 degree scope. Above findings were noted.  The patient was placed in the supine position reversed from general anesthesia and awake in stable condition.  At this point she was taken to the recovery room.

## 2012-02-05 NOTE — Anesthesia Postprocedure Evaluation (Signed)
Anesthesia Post Note  Patient: [name] [name]  Procedure(s) Performed: Procedure(s) (LRB): HYSTERECTOMY TOTAL LAPAROSCOPIC (N/A) CYSTOSCOPY (N/A)  Anesthesia type: General  Patient location: PACU  Post pain: Pain level controlled  Post assessment: Post-op Vital signs reviewed  Last Vitals:  Filed Vitals:   02/05/12 1214  BP: 138/83  Pulse: 89  Temp: 36.5 C    Post vital signs: Reviewed  Level of consciousness: sedated  Complications: No apparent anesthesia complicationsfj

## 2012-02-05 NOTE — H&P (View-Only) (Signed)
--------------------------------------------------------------------------------  [name] [name] Seen  46 Y  old  Female, DOB: 10-07-1975  [address] [place], [id number]  Home: [phone]      [name] [name]    Insurance: [name]  Referring: Delbert Harness  Appointment Facility: [facility]   -------------------------------------------------------------------------------- 01/30/2012 [name] [name]. [name] Constant, MD    Reason for Appointment  1. pre op surgery 02/05/2012 [facility]      History of Present Illness  here for:          36 year old female presents with c/o here for f/u of gyn problem as she has a h/o endometriosis and a small myoma but has significant pain that is interfering with her ADLs and is there throughout the month and is exacerbated with her menses and worse before and afterward. She states she is here to schedule hysterectomy with laparoscope and conserve ovaries..         c/o here for problem gyn visit for discussion of pelvic pain and dysmenorrhea and pain at other times as she has completed 3 pill packs of combo OCs and these had a regular mostly pain free menses and then the last two months the pain has been progressively worsening with the last month the menses came during the 3rd week and lasted 10 d. The pain initially began almost 2 years ago and the pain at other times of the month began 6 months ago. She was given all of her options to include continue current management with the potential for addiction discussed with the patient as the ibuprofen and the ultram did not work and to function she used the percocet #10 over the weekend, also the continuous OCs and the possibility of progesterone only, the use of lupron and the use of surgery with diagnostic laparoscopy and hysteroscopy D&C with ablation and also hysterectomy +/- SO..     Current Medications  Sprintec 28 0.25-35 MG-MCG Tablet 1 tablet Once a day  Vicodin 5-500 mg Tablet 1 po  q 6 hours   Medication List reviewed and reconciled with the patient        Past Medical History  Endometriosis      Surgical History  Denies Past Surgical History      Family History  Father: deceased heart attack 2011-06-04   Mother: alive   Paternal Grand Father: deceased   Paternal Grand Mother: deceased   Maternal Grand Father: deceased   Maternal Grand Mother: deceased colon/rectal CA   4 brother(s) , 3 sister(s) - healthy. 1 son(s) , 2 daughter(s) - healthy.       Social History  Tobacco Use:         Tobacco Use/Smoking  Are you a: nonsmoker .   Miscellaneous:         no Caffeine.        Seat belt use: Yes.        Children: yes.        no Community involvements.        no Domestic violence.        no Exercise.        Home smoke detector use: yes.        no Legal problems.        Marital status: married.        no Natural support system.        New since last visit: none.        Occupation: weeks/months/years, works full-time.  Occupational exposure: none.        Others at home: none.        Pets: cats: 1 dogs:3.        no Sexual abuse.        no Sexually active.        no Travel outside of the [place].        no Verbal abuse.     Gyn History  Periods :  every 10 days, bc didnt help, extremely heavy menses .   Sexual activity  currently sexually active, , with men.   Last pap smear date  11/06/2011 neg/HPV neg.   Last mammogram date  10/2011 benign.   Abnormal pap smear  no.   Date of Last Period  12/24/2011.   Sexually Transmitted Diseases (STDs)  none.   Birth control  sprintec.      OB History  Total pregnancies  3.   Total living children  3.   Pregnancy # 1:  normal spontaneous vaginal delivery (NSVD), no complications, girl, 39 weeks.   Pregnancy # 2:  normal spontaneous vaginal delivery (NSVD), no complications, boy, 40 weeks.   Pregnancy # 3  normal spontaneous vaginal delivery (NSVD), no complications, girl, 40 weeks .       Allergies  Tylenol: hives, vomiting: Allergy      Hospitalization/Major Diagnostic Procedure  child birth X 3       Review of Systems  General/Constitutional:          Denies Change in appetite.  Denies Chills.  Denies Fatigue.  Denies Fever.  Denies Headache.  Denies Lightheadedness.  Denies Sleep disturbance.  Denies Weight gain.  Denies Weight loss.      Allergy/Immunology:          Denies Blistering of skin.  Denies Congestion.  Denies Cough.  Denies Hives.  Denies Itching.  Denies Rash.  Denies Sneezing.  Denies Watery eyes.  Denies Wheezing.      Ophthalmologic:          Denies Blurred vision.  Denies Diminished visual acuity.  Denies Discharge.  Denies Dry eye.  Denies Flashes of light in the visual field.  Denies Floaters in the visual field.  Denies Itching and redness.  Denies Pain.  Denies Red eye.  Denies Vision screen.      ENT:          Denies Blocked ear.  Denies Decreased hearing.  Denies Decreased sense of smell.  Denies Difficulty swallowing.  Denies Dry mouth.  Denies Ear pain.  Denies Hearing screen.  Denies Nosebleed.  Denies Ringing in the ears.  Denies Sinus pain.  Denies Sore throat.  Denies Swollen glands.      Endocrine:          Denies Cold intolerance.  Denies Difficulty sleeping.  Denies Dizziness.  Denies Excessive sweating.  Denies Excessive thirst.  Denies Frequent urination.  Denies Heat intolerance.  Denies Irregular menses.  Denies Weakness.  Denies Weight loss.      Respiratory:          Denies Breathing pattern.  Denies Chest pain.  Denies Cough.  Denies Hemoptysis.  Denies Pain with inspiration.  Denies Shortness of breath at rest.  Denies Shortness of breath with exertion.  Denies Sputum production.  Denies Wheezing.      Breast:          Denies Bloody nipple discharge.  Denies Breast lump.  Denies Breast pain.  Denies Breast swelling.  Denies Fever.  Denies Gland swelling.  Denies Nipple discharge.  Denies Red skin.  Denies Weight loss.       Cardiovascular:          Denies Chest pain at rest.  Denies Chest pain with exertion.  Denies Claudication.  Denies Cyanosis.  Denies Difficulty laying flat.  Denies Dizziness.  Denies Dyspnea on exertion.  Denies Fluid accumulation in the legs.  Denies Irregular heartbeat.  Denies Orthopnea.  Denies Palpitations.  Denies Shortness of breath.  Denies Weakness.  Denies Weight gain.      Gastrointestinal:          Admits Abdominal pain, admits, [name], left lower quadrant, right lower quadrant, lower abdomen, that is severe, sharp , radiating to back, radiating to groin.  Denies Blood in stool.  Denies Change in bowel habits.  Denies Constipation.  Denies Decreased appetite.  Denies Diarrhea.  Denies Difficulty swallowing.  Denies Exposure to hepatitis.  Denies Heartburn.  Denies Hematemesis.  Denies Nausea.  Denies Rectal bleeding.  Denies Vomiting.  Denies Weight loss.      Women Only:          Denies Breast lump.  Denies Breast pain.  Denies Discharge from the breast.  Admits Heavy bleeding during menses, admits.  Denies Hot flashes.  Admits Irregular menses, admits, associated with cramping, longer periods, more frequent periods.  Denies Missed periods.  Denies Painful intercourse.  Admits Painful menses, admits.  Admits Vaginal bleeding between periods, admits.  Denies Vaginal discharge/itching.      Genitourinary:          Admits Abdominal pain/swelling, admits.  Denies Blood in urine.  Denies Difficulty urinating.  Denies Frequent urination.  Admits Pain in lower back, admits.  Denies Painful urination.          Vital Signs  HR 70 /min, BP 120/58 mm Hg, Ht 67 in, Wt 211 lbs, BMI 33.04 Index, RR 16 /min, Ht-cm 170.18 cm, Wt-kg 95.71 kg.    Examination  General Examination:        GENERAL APPEARANCE: in no acute distress, well developed, well nourished.          HEAD: normocephalic, atraumatic.          EYES: pupils equal, round, reactive to light.          EARS: normal.          ORAL  CAVITY: mucosa moist.          THROAT: clear.          NECK/THYROID: neck supple, full range of motion, no cervical lymphadenopathy.          LYMPH NODES: normal, no axillary, supraclavicular or inguinal adenopathy.          SKIN: no suspicious lesions, warm and dry.          HEART: no murmurs, regular rate and rhythm, S1, S2 normal.          LUNGS: clear to auscultation bilaterally.          ABDOMEN: normal, bowel sounds present, soft, nontender, nondistended.          BACK: no costovertebral angle tenderness.          EXTREMITIES: no clubbing, cyanosis, or edema.          NEUROLOGIC: nonfocal, motor strength normal upper and lower extremities, sensory exam intact.       surgery consenting:  surgery counseling Surgery counseling: The patient is aware that the risks  specific to the procedure are that there is a potential for bleeding and the possibility of a transfusion. She realizes that this procedure will make her sterile. There is a potential for damage to other intra-abdominal organs to include ovaries, tubes, bladder, ureters, and bowel, sometimes recognized at the time of surgery and repaired at that time with a potential for a larger incision and/or a second surgeon.   The patient is also aware of the fact that these may not be discovered until later date and that she would need to let us know about any postoperative complications by calling me, the office or proceeding to the emergency room. If these complications are not found at the time of surgery and are discovered later date they may need surgical repair at that time.   The patient is also aware the potential for infection and the potential for prolonged hospital stay and the potential for antibiotics on an inpatient and/or outpatient basis.   Patient is aware of the potential for treating her pain and being free of pain, but the pain may not be cured with this surgery and the pain may may be worsened or new after the surgery.  This could be because of scar tissue or the result of healing post procedure or the result that the pain is related to another potential etiology. She understands the pelvic pain is complex with the potential for gynecological, musculoskeletal, neurological, urological and gastrointestinal etiologies, however, we think that the pain is related to the pathology that is gynecological and is the results of her endometriosis. She understands the above and wishes to proceed with the procedure .   The patient is also aware that we will attempt of procedure with minimally invasive technique. This is a laparoscopic hysterectomy. However, the patient also understands that if for some reason there is a judgment that would occur during the procedure to convert this to a open procedure or a laparotomy this would have to be done.    She is not having her ovaries removed but we did briefly discuss ERT : If one or both ovaries happen to be removed secondary to safety with regard to bleeding or appearance concerns this could potentially make the patient menopausal. She understands this will be a naturally occurring phenomena and with the average age of 36 years old for menopause. She also understands that we will have to deal with it potential symptoms of menopause. She is aware that that potential could be estrogen replacement therapy she has now been made aware of the increased risk of clotting and stroke with the use of estrogen therapy. She is aware of the fact that this may take several months after her surgery to get her hormonal balance where she is satisfied. The patient understands them wishes to proceed.   The patient was counseled regarding the risks, benefits, and alternatives of estrogen replacement therapy. The risks include, but not completely limited to the increased risk of deep vein thrombosis, pulmonary embolus, and stroke. These risks are thought to be mitigated by a baby aspirin per day, which is  recommended to this patient and also the use of topical estrogen. The benefits clearly are for the prevention and/or treatment of osteoporosis, osteopenia and bone loss. Other benefits include decreased climacteric/menopausal symptoms of hot flashes, night sweats, mood, lability, insomnia, and vaginal dryness. Alternatively, there is no life threatening reason to start estrogen therapy. Certainly there are medications to treat osteopenia and osteoporosis and there are bone building exercises of  weightbearing and supplemental calcium and vitamin D to help decrease bone loss. There are also holistic and over-the-counter medicines that can mitigate the symptoms of menopause. Certainly, vaginal dryness can be treated with topical estrogen and decrease the risks of systemic estrogen. Breast tenderness can result as well as abnormal or postmenopausal bleeding. Certainly the use of progesterone can prevent potential for endometrial cancer. Since the patient will not have a uterus both estrogen and progesterone would not have to be used. Certainly the goal is to find the lowest effective dose to satisfy symptoms and maintain bone density. This can be accomplished over the course of several months or years with the goal being to individualize the therapy   Transfusion counseling: The patient is aware of the possibility with this procedure of the use of blood replacement products in the form of a transfusion. The patient is aware of the risks benefits and alternatives of these. She is aware that we would only use these on an emergency basis or sometimes if considered to significantly reduce the risk of transfusion requirement during surgery. The patient is aware of the risk of the each unit of transfusion having a risk of HIV being approximately 1 in 1 million and having a risk of hepatitis being 1 in 2000, with sometimes the development of chronic active hepatitis bleeding to the potential for liver failure and liver  transplant, and having a low substantial risk of transfusion reaction were her body would reject the replacement when we thought she needed it.         Assessments  1. Endometriosis of other specified sites - 617.8 (Primary)  2. Excessive or frequent menstruation - 626.2  3. Body Mass Index 33.0-33.9, adult - V85.33  4. Other chronic pain - 338.29  5. Dysmenorrhea - 625.3  6. Leiomyoma of uterus, unspecified - 218.9    Treatment  1. Endometriosis of other specified sites   will proceed with [facility] and conserve the ovaries preop mag citrate SCDs empiric antibiotics for SCIP.             Procedure Codes  Preop Pre-op Visit No charge      Follow Up  2 Week postop            Electronically signed by Filbert Berthold , MD on 02/04/2012 at 02:11 PM EST  Sign off status: Pending     --------------------------------------------------------------------------------  [facility] [address] [place], [place] [id number] Tel: [phone] Fax: [phone]         --------------------------------------------------------------------------------   Patient: [name] [name], [name] [name]    DOB: 1976-01-14     Progress Note: [name] [name]. [name] Constant, MD    01/30/2012

## 2012-02-05 NOTE — Interval H&P Note (Signed)
History and Physical Interval Note:  02/05/2012 1:28 PM  [name] [name]  has presented today for surgery, with the diagnosis of endometriosis;fibroids  The various methods of treatment have been discussed with the patient and family. After consideration of risks, benefits and other options for treatment, the patient has consented to  Procedure(s) (LRB) with comments: HYSTERECTOMY TOTAL LAPAROSCOPIC (N/A) as a surgical intervention .  The patient's history has been reviewed, patient examined, no change in status, stable for surgery.  I have reviewed the patient's chart and labs.  Questions were answered to the patient's satisfaction.     [name] [name] H.

## 2012-02-05 NOTE — Anesthesia Preprocedure Evaluation (Addendum)
Anesthesia Evaluation  Patient identified by MRN, date of birth, ID band Patient awake    Reviewed: Allergy & Precautions, H&P , Patient's Chart, lab work & pertinent test results, reviewed documented beta blocker date and time   History of Anesthesia Complications Negative for: history of anesthetic complications  Airway Mallampati: II TM Distance: >3 FB Neck ROM: full    Dental No notable dental hx.    Pulmonary neg pulmonary ROS,  breath sounds clear to auscultation  Pulmonary exam normal       Cardiovascular Exercise Tolerance: Good negative cardio ROS  Rhythm:regular Rate:Normal     Neuro/Psych negative neurological ROS  negative psych ROS   GI/Hepatic negative GI ROS, Neg liver ROS,   Endo/Other  negative endocrine ROS  Renal/GU negative Renal ROS     Musculoskeletal   Abdominal   Peds  Hematology negative hematology ROS (+)   Anesthesia Other Findings   Reproductive/Obstetrics negative OB ROS                           Anesthesia Physical Anesthesia Plan  ASA: I  Anesthesia Plan: General ETT   Post-op Pain Management:    Induction:   Airway Management Planned:   Additional Equipment:   Intra-op Plan:   Post-operative Plan:   Informed Consent: I have reviewed the patients History and Physical, chart, labs and discussed the procedure including the risks, benefits and alternatives for the proposed anesthesia with the patient or authorized representative who has indicated his/her understanding and acceptance.   Dental Advisory Given  Plan Discussed with: CRNA and Surgeon  Anesthesia Plan Comments:        Anesthesia Quick Evaluation  

## 2012-02-05 NOTE — Progress Notes (Signed)
Rockwell Alexandria RN didn't notice I was charting under [name] Mail RN log in

## 2012-02-06 ENCOUNTER — Encounter (HOSPITAL_COMMUNITY): Payer: Self-pay | Admitting: *Deleted

## 2012-02-06 LAB — CBC
Platelets: 275 10*3/uL (ref 150–400)
RBC: 3.52 MIL/uL — ABNORMAL LOW (ref 3.87–5.11)
WBC: 11.5 10*3/uL — ABNORMAL HIGH (ref 4.0–10.5)

## 2012-02-06 MED ORDER — OXYCODONE-ACETAMINOPHEN 5-325 MG PO TABS: 1.0000 | ORAL_TABLET | ORAL | Status: DC | PRN

## 2012-02-06 NOTE — Addendum Note (Signed)
Addendum  created 02/06/12 8657 by Shanon Payor, CRNA   Modules edited:Notes Section

## 2012-02-06 NOTE — Anesthesia Postprocedure Evaluation (Signed)
  Anesthesia Post-op Note  Patient: [name] [name]  Procedure(s) Performed: Procedure(s) (LRB) with comments: HYSTERECTOMY TOTAL LAPAROSCOPIC (N/A) CYSTOSCOPY (N/A)  Patient Location: [facility]  Anesthesia Type:General  Level of Consciousness: awake, alert  and oriented  Airway and Oxygen Therapy: Patient Spontanous Breathing and Patient connected to nasal cannula oxygen  Post-op Pain: none  Post-op Assessment: Post-op Vital signs reviewed and Patient's Cardiovascular Status Stable  Post-op Vital Signs: Reviewed and stable  Complications: No apparent anesthesia complications

## 2012-02-06 NOTE — Discharge Summary (Addendum)
Physician Discharge Summary  Patient ID: [name] [name] MRN: [id number] DOB/AGE: 10-26-1975 36 y.o.  Admit date: 02/05/2012 Discharge date: 02/06/2012  Admission Diagnoses: Fibroid uterus and pelvic pain and endometriosis and abnormal uterine bleeding  Discharge Diagnoses: same Active Problems:  * No active hospital problems. *   CBC    Component Value Date/Time   WBC 11.5* 02/06/2012 0535   RBC 3.52* 02/06/2012 0535   HGB 10.5* 02/06/2012 0535   HCT 31.9* 02/06/2012 0535   PLT 275 02/06/2012 0535   MCV 90.6 02/06/2012 0535   MCH 29.8 02/06/2012 0535   MCHC 32.9 02/06/2012 0535   RDW 13.7 02/06/2012 0535    Dibcscharged Condition: good  Hospital Course: Pt went to OR for [facility] bilateral salpingectomy and cystoscopy and with good result and the pt recovered well throughout the night of observation and the patient had adequate return of ADLs prior to discharge  Consults: None  Significant Diagnostic Studies: path pending  Treatments: surgery: [facility] bilateral salpingectomy  And cystoscopy  Discharge Exam: Blood pressure 104/58, pulse 64, temperature 98.4 F (36.9 C), temperature source Oral, resp. rate 18, height 5\' 7"  (1.702 m), weight 96.163 kg (212 lb), last menstrual period 01/21/2012, SpO2 98.00%. General appearance: alert, cooperative and no distress Back: no tenderness to percussion or palpation, symmetric, no curvature. ROM normal. No CVA tenderness. GI: soft, non-tender; bowel sounds normal; no masses,  no organomegaly Incision/Wound:without evidence of infection Ext: without Homan's sign and nontender and without edema  Disposition: 01-Home or Self Care  Discharge Orders    Future Orders Please Complete By Expires   Diet - low sodium heart healthy      Increase activity slowly      Discharge instructions      Comments:   Call for any problems or concerns   Driving Restrictions      Comments:   None for 2 weeks   Lifting restrictions      Comments:   No  heavy lifting   Sexual Activity Restrictions      Comments:   None for 4-6 weeks   Discharge wound care:      Comments:   No dressing and may shower and call with problems   Call MD for:  extreme fatigue      Call MD for:  persistant dizziness or light-headedness      Call MD for:  hives      Call MD for:  difficulty breathing, headache or visual disturbances      Call MD for:  redness, tenderness, or signs of infection (pain, swelling, redness, odor or green/yellow discharge around incision site)      Call MD for:  severe uncontrolled pain      Call MD for:  persistant nausea and vomiting      Call MD for:  temperature >100.4      Schedule appointment      Scheduling Instructions:   Call for follow up appointment in 2 weeks       Medication List     As of 02/06/2012 12:53 PM    TAKE these medications         cyclobenzaprine 10 MG tablet   Commonly known as: FLEXERIL   Take 1 tablet (10 mg total) by mouth 2 (two) times daily as needed for muscle spasms.      HYDROcodone-acetaminophen 5-325 MG per tablet   Commonly known as: NORCO/VICODIN   Take 1 tablet by mouth every 4 (four) hours as needed  for pain.      ibuprofen 800 MG tablet   Commonly known as: ADVIL,MOTRIN   Take 1 tablet every 8 hours as needed for uterine cramping.      ibuprofen 800 MG tablet   Commonly known as: ADVIL,MOTRIN   Take 1 tablet (800 mg total) by mouth 3 (three) times daily.      multivitamin with minerals Tabs   Take 1 tablet by mouth daily.      ondansetron 4 MG disintegrating tablet   Commonly known as: ZOFRAN-ODT   Take 1 tablet (4 mg total) by mouth every 8 (eight) hours as needed for nausea.      ORTHO TRI-CYCLEN LO 0.18/0.215/0.25 MG-25 MCG tab   Generic drug: Norgestimate-Ethinyl Estradiol Triphasic   Take 1 tablet by mouth daily.      oxyCODONE-acetaminophen 5-325 MG per tablet   Commonly known as: PERCOCET/ROXICET   Take 2 tablets by mouth every 4 (four) hours as needed for  pain.      oxyCODONE-acetaminophen 5-325 MG per tablet   Commonly known as: PERCOCET/ROXICET   Take 1 tablet by mouth every 4 (four) hours as needed for pain.       ..c  cbSigned: Colandra Ohanian H. 02/06/2012, 12:53 PM

## 2012-02-06 NOTE — Progress Notes (Signed)
Discharge instructions provided to patient at bedside.  Activity instructions, medications, follow up appointments and community resources discussed.  No questions at this time.  Contact number provided to inform hospital of funeral home of their choice.  Patient left unit with personal belongings in stable condition.  Osvaldo Angst, RN----------

## 2012-02-06 NOTE — Progress Notes (Signed)
Patient ID: [name] [name], female   DOB: [birth year], 36 y.o.   MRN: [id number] See dc summary

## 2012-08-16 ENCOUNTER — Emergency Department (HOSPITAL_BASED_OUTPATIENT_CLINIC_OR_DEPARTMENT_OTHER)
Admission: EM | Admit: 2012-08-16 | Discharge: 2012-08-16 | Disposition: A | Discharge status: Home / Self Care | Payer: BC Managed Care – PPO | Attending: Emergency Medicine | Admitting: Emergency Medicine

## 2012-08-16 ENCOUNTER — Emergency Department (HOSPITAL_BASED_OUTPATIENT_CLINIC_OR_DEPARTMENT_OTHER): Payer: BC Managed Care – PPO

## 2012-08-16 ENCOUNTER — Encounter (HOSPITAL_BASED_OUTPATIENT_CLINIC_OR_DEPARTMENT_OTHER): Payer: Self-pay | Admitting: Family Medicine

## 2012-08-16 DIAGNOSIS — R110 Nausea: Secondary | ICD-10-CM | POA: Insufficient documentation

## 2012-08-16 DIAGNOSIS — Z8742 Personal history of other diseases of the female genital tract: Secondary | ICD-10-CM | POA: Insufficient documentation

## 2012-08-16 DIAGNOSIS — R109 Unspecified abdominal pain: Secondary | ICD-10-CM | POA: Insufficient documentation

## 2012-08-16 DIAGNOSIS — Z79899 Other long term (current) drug therapy: Secondary | ICD-10-CM | POA: Insufficient documentation

## 2012-08-16 LAB — CBC WITH DIFFERENTIAL/PLATELET
Basophils Relative: 0 % (ref 0–1)
Eosinophils Absolute: 0.1 10*3/uL (ref 0.0–0.7)
Eosinophils Relative: 1 % (ref 0–5)
MCH: 30.4 pg (ref 26.0–34.0)
MCHC: 33.3 g/dL (ref 30.0–36.0)
Neutrophils Relative %: 76 % (ref 43–77)
Platelets: 292 10*3/uL (ref 150–400)

## 2012-08-16 LAB — BASIC METABOLIC PANEL
BUN: 11 mg/dL (ref 6–23)
Calcium: 9.7 mg/dL (ref 8.4–10.5)
GFR calc Af Amer: >90 mL/min (ref >90)
GFR calc non Af Amer: >90 mL/min (ref >90)
Potassium: 4.2 mEq/L (ref 3.5–5.1)
Sodium: 139 mEq/L (ref 135–145)

## 2012-08-16 LAB — URINALYSIS, ROUTINE W REFLEX MICROSCOPIC
Bilirubin Urine: NEGATIVE
Ketones, ur: NEGATIVE mg/dL
Nitrite: NEGATIVE
Protein, ur: NEGATIVE mg/dL
Urobilinogen, UA: 0.2 mg/dL (ref 0.0–1.0)

## 2012-08-16 MED ORDER — KETOROLAC TROMETHAMINE 60 MG/2ML IM SOLN
60.0000 mg | Freq: Once | INTRAMUSCULAR | Status: AC
  Administered 2012-08-16: 60 mg via INTRAMUSCULAR
  Filled 2012-08-16: qty 2

## 2012-08-16 MED ORDER — TRAMADOL HCL 50 MG PO TABS: 50.0000 mg | ORAL_TABLET | Freq: Four times a day (QID) | ORAL | Status: DC | PRN

## 2012-08-16 NOTE — ED Notes (Signed)
Pt c/o left flank pain radiating into LLQ x 2 days. Denies dysuria, normal bowel movements per pt. Denies n/v.

## 2012-08-16 NOTE — ED Provider Notes (Signed)
History    CSN: [id number] Arrival date & time 08/16/12  4098  First MD Initiated Contact with Patient 08/16/12 [id number]     Chief Complaint  Patient presents with  . Flank Pain   (Consider location/radiation/quality/duration/timing/severity/associated sxs/prior Treatment) HPI   Patient is a 37 yo F PMHx significant for endometriosis and laparoscopic hysterectomy presenting to the ED for two days of intermittent sharp left sided flank pain w/ radiation to LLQ. Patient rates her pain "11/10." No aggravating factors. Pain is alleviated with changing positions. Patient denies any associated dysuria, hematuria, vaginal discharge, fevers, chills. Patient denies any history of kidney stones or kidney infections.   Past Medical History  Diagnosis Date  . No pertinent past medical history   . Endometriosis    Past Surgical History  Procedure Laterality Date  . No past surgeries    . Laparoscopic hysterectomy  02/05/2012    Procedure: HYSTERECTOMY TOTAL LAPAROSCOPIC;  Surgeon: Delbert Harness, MD;  Location: [facility];  Service: Gynecology;  Laterality: N/A;  . Cystoscopy  02/05/2012    Procedure: CYSTOSCOPY;  Surgeon: Delbert Harness, MD;  Location: [facility];  Service: Gynecology;  Laterality: N/A;   Family History  Problem Relation Age of Onset  . Sudden death Father   . Heart attack Father   . Diabetes Neg Hx   . Hyperlipidemia Neg Hx   . Hypertension Neg Hx    History  Substance Use Topics  . Smoking status: Never Smoker   . Smokeless tobacco: Not on file  . Alcohol Use: No   OB History   Grav Para Term Preterm Abortions TAB SAB Ect Mult Living                 Review of Systems  Constitutional: Negative for fever and chills.  Respiratory: Negative for shortness of breath.   Cardiovascular: Negative for chest pain.  Gastrointestinal: Positive for nausea and abdominal pain. Negative for vomiting, diarrhea, constipation and blood in stool.  Genitourinary: Positive for flank pain.  Negative for dysuria, vaginal bleeding and vaginal discharge.  All other systems reviewed and are negative.    Allergies  Review of patient's allergies indicates no known allergies.  Home Medications   Current Outpatient Rx  Name  Route  Sig  Dispense  Refill  . ibuprofen (ADVIL,MOTRIN) 800 MG tablet   Oral   Take 1 tablet (800 mg total) by mouth 3 (three) times daily.   21 tablet   0   . cyclobenzaprine (FLEXERIL) 10 MG tablet   Oral   Take 1 tablet (10 mg total) by mouth 2 (two) times daily as needed for muscle spasms.   20 tablet   0   . HYDROcodone-acetaminophen (NORCO/VICODIN) 5-325 MG per tablet   Oral   Take 1 tablet by mouth every 4 (four) hours as needed for pain.   6 tablet   0   . ibuprofen (ADVIL,MOTRIN) 800 MG tablet      Take 1 tablet every 8 hours as needed for uterine cramping.   30 tablet   0   . Multiple Vitamin (MULTIVITAMIN WITH MINERALS) TABS   Oral   Take 1 tablet by mouth daily.         . Norgestimate-Ethinyl Estradiol Triphasic (ORTHO TRI-CYCLEN LO) 0.18/0.215/0.25 MG-25 MCG tab   Oral   Take 1 tablet by mouth daily.         . ondansetron (ZOFRAN ODT) 4 MG disintegrating tablet   Oral   Take  1 tablet (4 mg total) by mouth every 8 (eight) hours as needed for nausea.   20 tablet   0   . oxyCODONE-acetaminophen (PERCOCET/ROXICET) 5-325 MG per tablet   Oral   Take 2 tablets by mouth every 4 (four) hours as needed for pain.   15 tablet   0   . oxyCODONE-acetaminophen (ROXICET) 5-325 MG per tablet   Oral   Take 1 tablet by mouth every 4 (four) hours as needed for pain.   30 tablet   0   . traMADol (ULTRAM) 50 MG tablet   Oral   Take 1 tablet (50 mg total) by mouth every 6 (six) hours as needed for pain.   10 tablet   0    BP 130/78  Pulse 75  Temp(Src) 98.1 F (36.7 C) (Oral)  Resp 20  SpO2 98%  LMP 01/21/2012 Physical Exam  Constitutional: She is oriented to person, place, and time. She appears well-developed and  well-nourished. No distress.  HENT:  Head: Normocephalic and atraumatic.  Mouth/Throat: Oropharynx is clear and moist.  Eyes: Conjunctivae are normal.  Neck: Neck supple.  Cardiovascular: Normal rate, regular rhythm and normal heart sounds.   Pulmonary/Chest: Effort normal and breath sounds normal.  Abdominal: Soft. Bowel sounds are normal. There is tenderness. There is no rigidity, no rebound, no guarding, no CVA tenderness, no tenderness at McBurney's point and negative Murphy's sign.    Musculoskeletal: She exhibits no edema.  Neurological: She is alert and oriented to person, place, and time.  Skin: Skin is warm and dry. She is not diaphoretic.  Psychiatric: She has a normal mood and affect.    ED Course  Procedures (including critical care time) Labs Reviewed  URINALYSIS, ROUTINE W REFLEX MICROSCOPIC - Abnormal; Notable for the following:    pH 8.5 (*)    All other components within normal limits  BASIC METABOLIC PANEL - Abnormal; Notable for the following:    Glucose, Bld 107 (*)    All other components within normal limits  CBC WITH DIFFERENTIAL   Ct Abdomen Pelvis Wo Contrast  08/16/2012   *RADIOLOGY REPORT*  Clinical Data: Left flank pain radiating to the left lower quadrant for 2 days, history of endometriosis  CT ABDOMEN AND PELVIS WITHOUT CONTRAST  Technique:  Multidetector CT imaging of the abdomen and pelvis was performed following the standard protocol without intravenous contrast.  Comparison: None.  Findings: Minimal atelectasis is noted medially at the left lung base.  The liver is unremarkable in the unenhanced state.  No calcified gallstones are seen.  The pancreas is normal in size and the pancreatic duct is not dilated.  The adrenal glands and spleen appear normal.  The stomach is not well distended.  No renal calculi are seen and there is no evidence of hydronephrosis.  The proximal ureters are normal in caliber.  The abdominal aorta is normal in caliber.  No  adenopathy is seen.  The urinary bladder is not well distended but no abnormality is seen.  The patient has previously undergone hysterectomy.  No adnexal lesion is seen.  No free fluid is noted within the pelvis. No abnormality of the colon is seen.  The terminal ileum is unremarkable, as is the appendix.  No bony abnormality is seen.  IMPRESSION:  1.  No explanation for the patient's left flank pain is seen.  No renal or ureteral calculi are noted. 2.  No diverticular disease is evident. 3.  Mild atelectasis medially at the left  lung base.  Developing pneumonia cannot be excluded.  Correlate clinically.   Original Report Authenticated By: Dwyane Dee, M.D.   1. Left flank pain   2. Abdominal pain     MDM  Patient is nontoxic, nonseptic appearing, in no apparent distress.  Patient's pain and other symptoms adequately managed in emergency department.  PO fluids tolerated.  Labs, imaging and vitals reviewed.  Patient does not meet the SIRS or Sepsis criteria.  On repeat exam patient does not have a surgical abdomen and there are nor peritoneal signs.  No indication of appendicitis, bowel obstruction, bowel perforation, cholecystitis, diverticulitis.  Patient discharged home with symptomatic treatment and given strict instructions for follow-up with their primary care physician.  I have also discussed reasons to return immediately to the ER.  Patient expresses understanding and agrees with plan. Patient d/w with Dr. Judd Lien, agrees with plan. Patient is stable at time of discharge.        Jeannetta Ellis, PA-C 08/16/12 1523

## 2012-08-17 NOTE — ED Provider Notes (Signed)
Medical screening examination/treatment/procedure(s) were performed by non-physician practitioner and as supervising physician I was immediately available for consultation/collaboration.  Doniesha Landau, MD 08/17/12 2124 

## 2013-02-24 ENCOUNTER — Emergency Department (HOSPITAL_BASED_OUTPATIENT_CLINIC_OR_DEPARTMENT_OTHER)
Admission: EM | Admit: 2013-02-24 | Discharge: 2013-02-24 | Disposition: A | Discharge status: Home / Self Care | Payer: BC Managed Care – PPO | Attending: Emergency Medicine | Admitting: Emergency Medicine

## 2013-02-24 ENCOUNTER — Encounter (HOSPITAL_BASED_OUTPATIENT_CLINIC_OR_DEPARTMENT_OTHER): Payer: Self-pay | Admitting: Emergency Medicine

## 2013-02-24 DIAGNOSIS — K089 Disorder of teeth and supporting structures, unspecified: Secondary | ICD-10-CM | POA: Insufficient documentation

## 2013-02-24 DIAGNOSIS — Z8742 Personal history of other diseases of the female genital tract: Secondary | ICD-10-CM | POA: Insufficient documentation

## 2013-02-24 DIAGNOSIS — Z79899 Other long term (current) drug therapy: Secondary | ICD-10-CM | POA: Insufficient documentation

## 2013-02-24 DIAGNOSIS — K0889 Other specified disorders of teeth and supporting structures: Secondary | ICD-10-CM

## 2013-02-24 MED ORDER — HYDROCODONE-ACETAMINOPHEN 5-325 MG PO TABS: 1.0000 | ORAL_TABLET | Freq: Four times a day (QID) | ORAL | Status: DC | PRN

## 2013-02-24 NOTE — ED Provider Notes (Signed)
Medical screening examination/treatment/procedure(s) were performed by non-physician practitioner and as supervising physician I was immediately available for consultation/collaboration.  EKG Interpretation   None        Threasa Beards, MD 02/24/13 1751

## 2013-02-24 NOTE — ED Provider Notes (Signed)
CSN: [id number]     Arrival date & time 02/24/13  1709 History   First MD Initiated Contact with Patient 02/24/13 1715     Chief Complaint  Patient presents with  . Dental Pain   (Consider location/radiation/quality/duration/timing/severity/associated sxs/prior Treatment) Patient is a 38 y.o. female presenting with tooth pain.  Dental Pain Location:  Lower Lower teeth location:  17/LL 3rd molar and 32/RL 3rd molar Quality:  Throbbing and constant Severity:  Severe Chronicity:  New Context: recent dental surgery   Relieved by:  Nothing Ineffective treatments:  Heat and NSAIDs  [name] [name] is a 38 y.o. female who presents to the ED with dental pain. She had her bottom wisdom teeth removed 6 days ago. She has been taking ibuprofen and hydrocodone. Her paper work said she should be eating and have minimal pain at this time. She called the office today to tell them she still can not eat and the pain is still severe and they told her to return to the office in the morning at 7 am. She is out of her pain medication and they told her to come to the ED for medication until her appointment tomorrow. She is taking antibiotics in addition to the pain medications. She denies fever or other problems.  Past Medical History  Diagnosis Date  . No pertinent past medical history   . Endometriosis    Past Surgical History  Procedure Laterality Date  . No past surgeries    . Laparoscopic hysterectomy  02/05/2012    Procedure: HYSTERECTOMY TOTAL LAPAROSCOPIC;  Surgeon: Jolayne Haines, MD;  Location: [facility];  Service: Gynecology;  Laterality: N/A;  . Cystoscopy  02/05/2012    Procedure: CYSTOSCOPY;  Surgeon: Jolayne Haines, MD;  Location: [facility];  Service: Gynecology;  Laterality: N/A;   Family History  Problem Relation Age of Onset  . Sudden death Father   . Heart attack Father   . Diabetes Neg Hx   . Hyperlipidemia Neg Hx   . Hypertension Neg Hx    History  Substance Use Topics  . Smoking  status: Never Smoker   . Smokeless tobacco: Not on file  . Alcohol Use: No   OB History   Grav Para Term Preterm Abortions TAB SAB Ect Mult Living                 Review of Systems As stated in HPI  Allergies  Review of patient's allergies indicates no known allergies.  Home Medications   Current Outpatient Rx  Name  Route  Sig  Dispense  Refill  . cyclobenzaprine (FLEXERIL) 10 MG tablet   Oral   Take 1 tablet (10 mg total) by mouth 2 (two) times daily as needed for muscle spasms.   20 tablet   0   . HYDROcodone-acetaminophen (NORCO/VICODIN) 5-325 MG per tablet   Oral   Take 1 tablet by mouth every 4 (four) hours as needed for pain.   6 tablet   0   . ibuprofen (ADVIL,MOTRIN) 800 MG tablet      Take 1 tablet every 8 hours as needed for uterine cramping.   30 tablet   0   . ibuprofen (ADVIL,MOTRIN) 800 MG tablet   Oral   Take 1 tablet (800 mg total) by mouth 3 (three) times daily.   21 tablet   0   . Multiple Vitamin (MULTIVITAMIN WITH MINERALS) TABS   Oral   Take 1 tablet by mouth daily.         Marland Kitchen  Norgestimate-Ethinyl Estradiol Triphasic (ORTHO TRI-CYCLEN LO) 0.18/0.215/0.25 MG-25 MCG tab   Oral   Take 1 tablet by mouth daily.         . ondansetron (ZOFRAN ODT) 4 MG disintegrating tablet   Oral   Take 1 tablet (4 mg total) by mouth every 8 (eight) hours as needed for nausea.   20 tablet   0   . oxyCODONE-acetaminophen (PERCOCET/ROXICET) 5-325 MG per tablet   Oral   Take 2 tablets by mouth every 4 (four) hours as needed for pain.   15 tablet   0   . oxyCODONE-acetaminophen (ROXICET) 5-325 MG per tablet   Oral   Take 1 tablet by mouth every 4 (four) hours as needed for pain.   30 tablet   0   . traMADol (ULTRAM) 50 MG tablet   Oral   Take 1 tablet (50 mg total) by mouth every 6 (six) hours as needed for pain.   10 tablet   0    BP 147/90  Pulse 73  Temp(Src) 98.3 F (36.8 C) (Oral)  Resp 16  Wt 212 lb (96.163 kg)  SpO2 100%  LMP  01/21/2012 Physical Exam  Nursing note and vitals reviewed. Constitutional: She is oriented to person, place, and time. She appears well-developed and well-nourished. No distress.  HENT:  Head: Atraumatic.  Mouth/Throat: Uvula is midline and oropharynx is clear and moist.    Area where lower 3rd molars removed with mild swelling noted and mild erythema.   Eyes: EOM are normal.  Neck: Neck supple.  Cardiovascular: Normal rate.   Pulmonary/Chest: Effort normal.  Abdominal: Soft. There is no tenderness.  Musculoskeletal: Normal range of motion.  Neurological: She is alert and oriented to person, place, and time. No cranial nerve deficit.  Skin: Skin is warm and dry.  Psychiatric: She has a normal mood and affect. Her behavior is normal.    ED Course  Procedures   MDM  39 y.o. female with pain s/p oral surgery 6 days ago. Out of pain medication. Will give Rx for Vicodin until she can follow up in the office in the morning.  Discussed with the patient and all questioned fully answered. She will return if any problems arise.    Medication List    ASK your doctor about these medications       cyclobenzaprine 10 MG tablet  Commonly known as:  FLEXERIL  Take 1 tablet (10 mg total) by mouth 2 (two) times daily as needed for muscle spasms.     HYDROcodone-acetaminophen 5-325 MG per tablet  Commonly known as:  NORCO/VICODIN  Take 1 tablet by mouth every 4 (four) hours as needed for pain.  Ask about: Which instructions should I use?     HYDROcodone-acetaminophen 5-325 MG per tablet  Commonly known as:  NORCO  Take 1 tablet by mouth every 6 (six) hours as needed for moderate pain.  Ask about: Which instructions should I use?     ibuprofen 800 MG tablet  Commonly known as:  ADVIL,MOTRIN  Take 1 tablet every 8 hours as needed for uterine cramping.     ibuprofen 800 MG tablet  Commonly known as:  ADVIL,MOTRIN  Take 1 tablet (800 mg total) by mouth 3 (three) times daily.      multivitamin with minerals Tabs tablet  Take 1 tablet by mouth daily.     ondansetron 4 MG disintegrating tablet  Commonly known as:  ZOFRAN ODT  Take 1 tablet (4 mg total) by  mouth every 8 (eight) hours as needed for nausea.     ORTHO TRI-CYCLEN LO 0.18/0.215/0.25 MG-25 MCG tab  Generic drug:  Norgestimate-Ethinyl Estradiol Triphasic  Take 1 tablet by mouth daily.     oxyCODONE-acetaminophen 5-325 MG per tablet  Commonly known as:  PERCOCET/ROXICET  Take 2 tablets by mouth every 4 (four) hours as needed for pain.     oxyCODONE-acetaminophen 5-325 MG per tablet  Commonly known as:  ROXICET  Take 1 tablet by mouth every 4 (four) hours as needed for pain.     traMADol 50 MG tablet  Commonly known as:  ULTRAM  Take 1 tablet (50 mg total) by mouth every 6 (six) hours as needed for pain.            [place], [place] 02/24/13 1743

## 2013-02-24 NOTE — Discharge Instructions (Signed)
Follow up with your oral surgeon tomorrow as scheduled. Return here as needed.

## 2013-02-24 NOTE — ED Notes (Signed)
Pt c/o post op dental pain from wisdom teeth removal x 6 days ago , pt is out of vicodin

## 2013-04-07 ENCOUNTER — Encounter (HOSPITAL_BASED_OUTPATIENT_CLINIC_OR_DEPARTMENT_OTHER): Payer: Self-pay | Admitting: Emergency Medicine

## 2013-04-07 ENCOUNTER — Emergency Department (HOSPITAL_BASED_OUTPATIENT_CLINIC_OR_DEPARTMENT_OTHER): Payer: BC Managed Care – PPO

## 2013-04-07 ENCOUNTER — Emergency Department (HOSPITAL_BASED_OUTPATIENT_CLINIC_OR_DEPARTMENT_OTHER)
Admission: EM | Admit: 2013-04-07 | Discharge: 2013-04-07 | Disposition: A | Discharge status: Home / Self Care | Payer: BC Managed Care – PPO | Attending: Emergency Medicine | Admitting: Emergency Medicine

## 2013-04-07 DIAGNOSIS — Z79899 Other long term (current) drug therapy: Secondary | ICD-10-CM | POA: Insufficient documentation

## 2013-04-07 DIAGNOSIS — Z791 Long term (current) use of non-steroidal anti-inflammatories (NSAID): Secondary | ICD-10-CM | POA: Insufficient documentation

## 2013-04-07 DIAGNOSIS — M25562 Pain in left knee: Secondary | ICD-10-CM

## 2013-04-07 DIAGNOSIS — M25569 Pain in unspecified knee: Secondary | ICD-10-CM | POA: Insufficient documentation

## 2013-04-07 MED ORDER — TRAMADOL HCL 50 MG PO TABS: 50.0000 mg | ORAL_TABLET | Freq: Four times a day (QID) | ORAL | Status: DC | PRN

## 2013-04-07 MED ORDER — MELOXICAM 7.5 MG PO TABS: 15.0000 mg | ORAL_TABLET | Freq: Every day | ORAL | Status: DC

## 2013-04-07 NOTE — ED Provider Notes (Signed)
Medical screening examination/treatment/procedure(s) were performed by non-physician practitioner and as supervising physician I was immediately available for consultation/collaboration.     Christina Gintz, MD 04/07/13 2323 

## 2013-04-07 NOTE — ED Notes (Signed)
Fell 6 months ago  Has had pain in left knee off and on since,  Worse x 1 week  States popping noise when walking up steps,  Increased pain when straightening knee,   Slight increased swelling

## 2013-04-07 NOTE — ED Notes (Addendum)
Patient states she has had constant pain in the left knee for the last one week.  States she has had intermittent pain for the last several months.  States she did have a minor fall onto her left knee approximately six months ago.

## 2013-04-07 NOTE — Discharge Instructions (Signed)
Take Mobic as prescribed. You may take Tylenol as needed for breakthrough pain control. Followup with your orthopedist.  Knee Pain Knee pain can be a result of an injury or other medical conditions. Treatment will depend on the cause of your pain. HOME CARE  Only take medicine as told by your doctor.  Keep a healthy weight. Being overweight can make the knee hurt more.  Stretch before exercising or playing sports.  If there is constant knee pain, change the way you exercise. Ask your doctor for advice.  Make sure shoes fit well. Choose the right shoe for the sport or activity.  Protect your knees. Wear kneepads if needed.  Rest when you are tired. GET HELP RIGHT AWAY IF:   Your knee pain does not stop.  Your knee pain does not get better.  Your knee joint feels hot to the touch.  You have a fever. MAKE SURE YOU:   Understand these instructions.  Will watch this condition.  Will get help right away if you are not doing well or get worse. Document Released: 05/02/2008 Document Revised: 04/28/2011 Document Reviewed: 05/02/2008 [facility] Patient Information 2014 [place], [place]. RICE: Routine Care for Injuries The routine care of many injuries includes Rest, Ice, Compression, and Elevation (RICE). HOME CARE INSTRUCTIONS  Rest is needed to allow your body to heal. Routine activities can usually be resumed when comfortable. Injured tendons and bones can take up to 6 weeks to heal. Tendons are the cord-like structures that attach muscle to bone.  Ice following an injury helps keep the swelling down and reduces pain.  Put ice in a plastic bag.  Place a towel between your skin and the bag.  Leave the ice on for 15-20 minutes, 03-04 times a day. Do this while awake, for the first 24 to 48 hours. After that, continue as directed by your caregiver.  Compression helps keep swelling down. It also gives support and helps with discomfort. If an elastic bandage has been applied, it  should be removed and reapplied every 3 to 4 hours. It should not be applied tightly, but firmly enough to keep swelling down. Watch fingers or toes for swelling, bluish discoloration, coldness, numbness, or excessive pain. If any of these problems occur, remove the bandage and reapply loosely. Contact your caregiver if these problems continue.  Elevation helps reduce swelling and decreases pain. With extremities, such as the arms, hands, legs, and feet, the injured area should be placed near or above the level of the heart, if possible. SEEK IMMEDIATE MEDICAL CARE IF:  You have persistent pain and swelling.  You develop redness, numbness, or unexpected weakness.  Your symptoms are getting worse rather than improving after several days. These symptoms may indicate that further evaluation or further X-rays are needed. Sometimes, X-rays may not show a small broken bone (fracture) until 1 week or 10 days later. Make a follow-up appointment with your caregiver. Ask when your X-ray results will be ready. Make sure you get your X-ray results. Document Released: 05/18/2000 Document Revised: 04/28/2011 Document Reviewed: 07/05/2010 [facility] Patient Information 2014 [place], [place].

## 2013-04-07 NOTE — ED Provider Notes (Signed)
CSN: [id number]     Arrival date & time 04/07/13  1734 History   First MD Initiated Contact with Patient 04/07/13 1857     Chief Complaint  Patient presents with  . Knee Pain    left     (Consider location/radiation/quality/duration/timing/severity/associated sxs/prior Treatment) HPI Comments: 38 year old female presents to the emergency department for left knee pain. Patient states that he has been hurting intermittently over the last 4 months. She states the pain has become constant over the last 8 days. The pain is a burning sensation. Patient states she feels as though it is "bone on bone". She has been trying elevation, rest, and ibuprofen without relief of symptoms. Patient called her orthopedist for followup but states she could not be seen for "a while" so she neglected to make any followup appointment. Pain is worse when ambulating and weightbearing. Patient denies associated fever, numbness, erythema, pallor, or weakness.  Patient is a 38 y.o. female presenting with knee pain. The history is provided by the patient. No language interpreter was used.  Knee Pain Associated symptoms: no back pain     Past Medical History  Diagnosis Date  . No pertinent past medical history   . Endometriosis    Past Surgical History  Procedure Laterality Date  . No past surgeries    . Laparoscopic hysterectomy  02/05/2012    Procedure: HYSTERECTOMY TOTAL LAPAROSCOPIC;  Surgeon: Jolayne Haines, MD;  Location: [facility];  Service: Gynecology;  Laterality: N/A;  . Cystoscopy  02/05/2012    Procedure: CYSTOSCOPY;  Surgeon: Jolayne Haines, MD;  Location: [facility];  Service: Gynecology;  Laterality: N/A;  . Abdominal hysterectomy     Family History  Problem Relation Age of Onset  . Sudden death Father   . Heart attack Father   . Diabetes Neg Hx   . Hyperlipidemia Neg Hx   . Hypertension Neg Hx    History  Substance Use Topics  . Smoking status: Never Smoker   . Smokeless tobacco: Not on file  .  Alcohol Use: No   OB History   Grav Para Term Preterm Abortions TAB SAB Ect Mult Living                 Review of Systems  Musculoskeletal: Positive for arthralgias. Negative for back pain and joint swelling.  All other systems reviewed and are negative.      Allergies  Review of patient's allergies indicates no known allergies.  Home Medications   Current Outpatient Rx  Name  Route  Sig  Dispense  Refill  . ibuprofen (ADVIL,MOTRIN) 800 MG tablet   Oral   Take 1 tablet (800 mg total) by mouth 3 (three) times daily.   21 tablet   0   . Multiple Vitamin (MULTIVITAMIN WITH MINERALS) TABS   Oral   Take 1 tablet by mouth daily.         . cyclobenzaprine (FLEXERIL) 10 MG tablet   Oral   Take 1 tablet (10 mg total) by mouth 2 (two) times daily as needed for muscle spasms.   20 tablet   0   . HYDROcodone-acetaminophen (NORCO) 5-325 MG per tablet   Oral   Take 1 tablet by mouth every 6 (six) hours as needed for moderate pain.   10 tablet   0   . HYDROcodone-acetaminophen (NORCO/VICODIN) 5-325 MG per tablet   Oral   Take 1 tablet by mouth every 4 (four) hours as needed for pain.  6 tablet   0   . ibuprofen (ADVIL,MOTRIN) 800 MG tablet      Take 1 tablet every 8 hours as needed for uterine cramping.   30 tablet   0   . Norgestimate-Ethinyl Estradiol Triphasic (ORTHO TRI-CYCLEN LO) 0.18/0.215/0.25 MG-25 MCG tab   Oral   Take 1 tablet by mouth daily.         . ondansetron (ZOFRAN ODT) 4 MG disintegrating tablet   Oral   Take 1 tablet (4 mg total) by mouth every 8 (eight) hours as needed for nausea.   20 tablet   0   . oxyCODONE-acetaminophen (PERCOCET/ROXICET) 5-325 MG per tablet   Oral   Take 2 tablets by mouth every 4 (four) hours as needed for pain.   15 tablet   0   . oxyCODONE-acetaminophen (ROXICET) 5-325 MG per tablet   Oral   Take 1 tablet by mouth every 4 (four) hours as needed for pain.   30 tablet   0   . traMADol (ULTRAM) 50 MG  tablet   Oral   Take 1 tablet (50 mg total) by mouth every 6 (six) hours as needed for pain.   10 tablet   0    BP 132/91  Temp(Src) 98.1 F (36.7 C) (Oral)  Resp 20  SpO2 100%  LMP 01/21/2012  Physical Exam  Nursing note and vitals reviewed. Constitutional: She is oriented to person, place, and time. She appears well-developed and well-nourished. No distress.  HENT:  Head: Normocephalic and atraumatic.  Eyes: Conjunctivae and EOM are normal. No scleral icterus.  Neck: Normal range of motion.  Cardiovascular: Normal rate, regular rhythm and intact distal pulses.   DP and PT pulses 2+ bilaterally  Pulmonary/Chest: Effort normal and breath sounds normal. No respiratory distress. She has no wheezes. She has no rales.  Musculoskeletal: Normal range of motion.       Left knee: She exhibits bony tenderness. She exhibits normal range of motion, no effusion, no deformity, no erythema, normal alignment, no LCL laxity and no MCL laxity.       Left upper leg: Normal.       Left lower leg: Normal.       Legs: Neurological: She is alert and oriented to person, place, and time. No sensory deficit. GCS eye subscore is 4. GCS verbal subscore is 5. GCS motor subscore is 6.  Reflex Scores:      Patellar reflexes are 2+ on the right side and 2+ on the left side.      Achilles reflexes are 2+ on the right side and 2+ on the left side. No gross sensory deficits appreciated.  Skin: Skin is warm and dry. No rash noted. She is not diaphoretic. No erythema. No pallor.  Psychiatric: She has a normal mood and affect. Her behavior is normal.    ED Course  Procedures (including critical care time) Labs Review Labs Reviewed - No data to display Imaging Review Dg Knee Complete 4 Views Left  04/07/2013   CLINICAL DATA:  Persistent diffuse left knee pain after a fall approximately 4 months ago. Crepitus when walking up stairs.  EXAM: LEFT KNEE - COMPLETE 4+ VIEW  COMPARISON:  None.  FINDINGS: No evidence  of acute, subacute, or healed fractures. Joint spaces well preserved. No intrinsic osseous abnormality. Possible small joint effusion.  IMPRESSION: No osseous abnormality.  Possible small joint effusion.   Electronically Signed   By: Evangeline Dakin M.D.   On: 04/07/2013 19:40  EKG Interpretation   None       MDM   Final diagnoses:  Knee pain, left    Uncomplicated left knee pain. Patient well and nontoxic appearing, hemodynamically stable, and afebrile. Patient is neurovascularly intact on physical exam with normal sensation to light touch. Normal reflexes bilaterally. No deformities, effusions, erythema, or heat to touch appreciated. No evidence of septic joint. X-ray negative for fracture or dislocation. Patient given knee sleeve and crutches in ED for symptoms. Revised Mobic as well as RICE for pain/symptom control. Recommended that patient follow up with her orthopedist. Return precautions provided and patient agreeable to plan with no unaddressed concerns.    Antonietta Breach, PA-C 04/07/13 2011

## 2013-04-29 ENCOUNTER — Encounter: Payer: Self-pay | Admitting: Family Medicine

## 2013-04-29 ENCOUNTER — Ambulatory Visit (INDEPENDENT_AMBULATORY_CARE_PROVIDER_SITE_OTHER): Payer: BC Managed Care – PPO | Admitting: Family Medicine

## 2013-04-29 VITALS — BP 127/84 | HR 67 | Ht 67.0 in | Wt 180.0 lb

## 2013-04-29 DIAGNOSIS — M25562 Pain in left knee: Secondary | ICD-10-CM

## 2013-04-29 DIAGNOSIS — M25569 Pain in unspecified knee: Secondary | ICD-10-CM

## 2013-04-29 MED ORDER — HYDROCODONE-ACETAMINOPHEN 5-325 MG PO TABS: 1.0000 | ORAL_TABLET | Freq: Four times a day (QID) | ORAL | Status: DC | PRN

## 2013-04-29 NOTE — Patient Instructions (Signed)
I'm concerned you have a medial meniscus tear in your knee. Start with conservative treatment. Icing 15 minutes at a time 3-4 times a day. Knee brace for support when up and walking around. Start physical therapy at least for a couple visits and do home exercises every day. Ibuprofen 600mg  three times a day OR aleve 2 tabs twice a day with food for pain and inflammation. You were given an injection today as well. Follow up with me in 4-6 weeks for reevaluation. If not improving would go ahead with MRI.

## 2013-05-03 ENCOUNTER — Ambulatory Visit: Payer: BC Managed Care – PPO | Attending: Family Medicine | Admitting: Physical Therapy

## 2013-05-03 ENCOUNTER — Telehealth: Payer: Self-pay | Admitting: Family Medicine

## 2013-05-03 ENCOUNTER — Encounter: Payer: Self-pay | Admitting: Family Medicine

## 2013-05-03 DIAGNOSIS — M25562 Pain in left knee: Secondary | ICD-10-CM | POA: Insufficient documentation

## 2013-05-03 NOTE — Telephone Encounter (Signed)
Spoke with patient and told her that we could not do another Rx for that medication. Told her to take Tylenol, Aleve and Capsaicin until he gets back and that it takes a few days for the injection to kick in. The patient stated that he would be back on 05-18-13.

## 2013-05-03 NOTE — Assessment & Plan Note (Signed)
radiographs negative.  Concerning for medial meniscal tear.  Will start with conservative treatment - knee brace, icing, physical therapy and home exercises.  Intraarticular injection given today.  NSAIDs.  F/u in 4-6 weeks.  Consider MRI if not improving.  After informed written consent, patient was seated on exam table. Left knee was prepped with alcohol swab and utilizing anteromedial approach, patient's left knee was injected intraarticularly with 3:1 marcaine: depomedrol. Patient tolerated the procedure well without immediate complications.

## 2013-05-03 NOTE — Telephone Encounter (Signed)
Unfortunately we can't for this class of medications.  She would have to rely on tylenol, aleve, topical capsaicin until he returns.  Hopefully the shot kicks in soon as well.

## 2013-05-03 NOTE — Progress Notes (Signed)
Patient ID: [name] [name], female   DOB: April 06, 1975, 38 y.o.   MRN: [id number]  PCP: No primary provider on file.  Subjective:   HPI: Patient is a 38 y.o. female here for left knee pain.  Patient reports her left knee has always had a popping sensation in it. Then about 8 months ago she fell down about 3 steps onto left knee. Had some improvement since then though and past few weeks pain has gotten worse within left knee. Hard to straighten knee out. Pain worse by end of day. Taking aleve. Feels like a rubbing sensation medially and behind kneecaps. No giving out. Radiographs in ED negative.  Past Medical History  Diagnosis Date  . No pertinent past medical history   . Endometriosis     Current Outpatient Prescriptions on File Prior to Visit  Medication Sig Dispense Refill  . cyclobenzaprine (FLEXERIL) 10 MG tablet Take 1 tablet (10 mg total) by mouth 2 (two) times daily as needed for muscle spasms.  20 tablet  0  . meloxicam (MOBIC) 7.5 MG tablet Take 2 tablets (15 mg total) by mouth daily.  30 tablet  0  . Multiple Vitamin (MULTIVITAMIN WITH MINERALS) TABS Take 1 tablet by mouth daily.      . Norgestimate-Ethinyl Estradiol Triphasic (ORTHO TRI-CYCLEN LO) 0.18/0.215/0.25 MG-25 MCG tab Take 1 tablet by mouth daily.      . ondansetron (ZOFRAN ODT) 4 MG disintegrating tablet Take 1 tablet (4 mg total) by mouth every 8 (eight) hours as needed for nausea.  20 tablet  0   No current facility-administered medications on file prior to visit.    Past Surgical History  Procedure Laterality Date  . No past surgeries    . Laparoscopic hysterectomy  02/05/2012    Procedure: HYSTERECTOMY TOTAL LAPAROSCOPIC;  Surgeon: Jolayne Haines, MD;  Location: [facility];  Service: Gynecology;  Laterality: N/A;  . Cystoscopy  02/05/2012    Procedure: CYSTOSCOPY;  Surgeon: Jolayne Haines, MD;  Location: [facility];  Service: Gynecology;  Laterality: N/A;  . Abdominal hysterectomy      No Known  Allergies  History   Social History  . Marital Status: Married    Spouse Name: N/A    Number of Children: N/A  . Years of Education: N/A   Occupational History  . Not on file.   Social History Main Topics  . Smoking status: Never Smoker   . Smokeless tobacco: Not on file  . Alcohol Use: No  . Drug Use: No  . Sexual Activity: Yes   Other Topics Concern  . Not on file   Social History Narrative  . No narrative on file    Family History  Problem Relation Age of Onset  . Sudden death Father   . Heart attack Father   . Diabetes Neg Hx   . Hyperlipidemia Neg Hx   . Hypertension Neg Hx     BP 127/84  Pulse 67  Ht 5\' 7"  (1.702 m)  Wt 180 lb (81.647 kg)  BMI 28.19 kg/m2  LMP 01/21/2012  Review of Systems: See HPI above.    Objective:  Physical Exam:  Gen: NAD  Left knee: No gross deformity, ecchymoses, effusion. TTP medial joint line, less post patellar facets.  No lateral joint line tenderness. FROM. Negative ant/post drawers. Negative valgus/varus testing. Negative lachmanns. Mild pain medially with mcmurrays, apleys.  Negative patellar apprehension, clarkes. NV intact distally.    Assessment & Plan:  1. Left knee pain -  radiographs negative.  Concerning for medial meniscal tear.  Will start with conservative treatment - knee brace, icing, physical therapy and home exercises.  Intraarticular injection given today.  NSAIDs.  F/u in 4-6 weeks.  Consider MRI if not improving.  After informed written consent, patient was seated on exam table. Left knee was prepped with alcohol swab and utilizing anteromedial approach, patient's left knee was injected intraarticularly with 3:1 marcaine: depomedrol. Patient tolerated the procedure well without immediate complications.

## 2013-06-06 ENCOUNTER — Ambulatory Visit: Payer: BC Managed Care – PPO | Admitting: Family Medicine

## 2013-11-22 ENCOUNTER — Encounter (INDEPENDENT_AMBULATORY_CARE_PROVIDER_SITE_OTHER): Payer: Self-pay

## 2013-11-22 ENCOUNTER — Ambulatory Visit (HOSPITAL_BASED_OUTPATIENT_CLINIC_OR_DEPARTMENT_OTHER)
Admission: RE | Admit: 2013-11-22 | Discharge: 2013-11-22 | Disposition: A | Discharge status: Home / Self Care | Payer: BC Managed Care – PPO | Source: Ambulatory Visit | Attending: Family Medicine | Admitting: Family Medicine

## 2013-11-22 ENCOUNTER — Encounter: Payer: Self-pay | Admitting: Family Medicine

## 2013-11-22 ENCOUNTER — Ambulatory Visit (INDEPENDENT_AMBULATORY_CARE_PROVIDER_SITE_OTHER): Payer: BC Managed Care – PPO | Admitting: Family Medicine

## 2013-11-22 VITALS — BP 125/84 | HR 87 | Ht 67.0 in | Wt 155.0 lb

## 2013-11-22 DIAGNOSIS — S99922A Unspecified injury of left foot, initial encounter: Secondary | ICD-10-CM | POA: Insufficient documentation

## 2013-11-22 DIAGNOSIS — M25562 Pain in left knee: Secondary | ICD-10-CM

## 2013-11-22 DIAGNOSIS — W19XXXA Unspecified fall, initial encounter: Secondary | ICD-10-CM | POA: Insufficient documentation

## 2013-11-22 DIAGNOSIS — S99912A Unspecified injury of left ankle, initial encounter: Secondary | ICD-10-CM

## 2013-11-22 MED ORDER — METHYLPREDNISOLONE ACETATE 40 MG/ML IJ SUSP
40.0000 mg | Freq: Once | INTRAMUSCULAR | Status: AC
  Administered 2013-11-22: 40 mg via INTRA_ARTICULAR

## 2013-11-22 NOTE — Patient Instructions (Signed)
You have an ankle sprain. Ice the area for 15 minutes at a time, 3-4 times a day Aleve 2 tabs twice a day with food OR ibuprofen 3 tabs three times a day with food for pain and inflammation. Elevate above the level of your heart when possible Crutches if needed to help with walking Bear weight when tolerated Use laceup ankle brace to help with stability while you recover from this injury. Come out of the boot/brace twice a day to do Up/down and alphabet exercises 2-3 sets of each. If not improving as expected, we may repeat x-rays or consider further testing like an MRI. Follow up with me in 2 weeks.

## 2013-11-24 ENCOUNTER — Encounter: Payer: Self-pay | Admitting: Family Medicine

## 2013-11-24 DIAGNOSIS — S99912A Unspecified injury of left ankle, initial encounter: Secondary | ICD-10-CM | POA: Insufficient documentation

## 2013-11-24 NOTE — Progress Notes (Signed)
Patient ID: [name] [name], female   DOB: November 27, 1975, 38 y.o.   MRN: [id number]  PCP: No primary provider on file.  Subjective:   HPI: Patient is a 38 y.o. female here for left ankle injury.  Patient reports she accidentally inverted left ankle just after midnight morning of 10/6. Difficulty bearing weight after this. + swelling and bruising. No prior injuries. Also wondering if she can get a repeat intraarticular injection for left knee - benefit for several months from last injection.  Past Medical History  Diagnosis Date  . No pertinent past medical history   . Endometriosis     Current Outpatient Prescriptions on File Prior to Visit  Medication Sig Dispense Refill  . chlorhexidine (PERIDEX) 0.12 % solution       . cyclobenzaprine (FLEXERIL) 10 MG tablet Take 1 tablet (10 mg total) by mouth 2 (two) times daily as needed for muscle spasms.  20 tablet  0  . HYDROcodone-acetaminophen (NORCO/VICODIN) 5-325 MG per tablet Take 1 tablet by mouth every 6 (six) hours as needed.  30 tablet  0  . meloxicam (MOBIC) 7.5 MG tablet Take 2 tablets (15 mg total) by mouth daily.  30 tablet  0  . Multiple Vitamin (MULTIVITAMIN WITH MINERALS) TABS Take 1 tablet by mouth daily.      . Norgestimate-Ethinyl Estradiol Triphasic (ORTHO TRI-CYCLEN LO) 0.18/0.215/0.25 MG-25 MCG tab Take 1 tablet by mouth daily.      . ondansetron (ZOFRAN ODT) 4 MG disintegrating tablet Take 1 tablet (4 mg total) by mouth every 8 (eight) hours as needed for nausea.  20 tablet  0   No current facility-administered medications on file prior to visit.    Past Surgical History  Procedure Laterality Date  . No past surgeries    . Laparoscopic hysterectomy  02/05/2012    Procedure: HYSTERECTOMY TOTAL LAPAROSCOPIC;  Surgeon: Jolayne Haines, MD;  Location: [facility];  Service: Gynecology;  Laterality: N/A;  . Cystoscopy  02/05/2012    Procedure: CYSTOSCOPY;  Surgeon: Jolayne Haines, MD;  Location: [facility];  Service: Gynecology;   Laterality: N/A;  . Abdominal hysterectomy      No Known Allergies  History   Social History  . Marital Status: Married    Spouse Name: N/A    Number of Children: N/A  . Years of Education: N/A   Occupational History  . Not on file.   Social History Main Topics  . Smoking status: Never Smoker   . Smokeless tobacco: Not on file  . Alcohol Use: No  . Drug Use: No  . Sexual Activity: Yes   Other Topics Concern  . Not on file   Social History Narrative  . No narrative on file    Family History  Problem Relation Age of Onset  . Sudden death Father   . Heart attack Father   . Diabetes Neg Hx   . Hyperlipidemia Neg Hx   . Hypertension Neg Hx     BP 125/84  Pulse 87  Ht 5\' 7"  (1.702 m)  Wt 155 lb (70.308 kg)  BMI 24.27 kg/m2  LMP 01/21/2012  Review of Systems: See HPI above.    Objective:  Physical Exam:  Gen: NAD  Left ankle: Mild-mod lateral swelling.  No bruising, other deformity. FROM with pain all motions. TTP over ATFL, lateral malleolus, base 5th metatarsal.  No other tenderness. 1+ ant drawer and talar tilt.   Negative syndesmotic compression. Thompsons test negative. NV intact distally.  Assessment & Plan:  1. Left ankle injury - radiographs negative for fracture.  Consistent with sprain.  Icing, nsaids, elevation, ASO.  Start motion exercises.  F/u in 2 weeks for reevaluation.  2. Left knee pain - radiographs negative. Concerning for medial meniscal tear.  Did well following injection though pain has recurred.  Repeat intraarticular injection given today.  Consider MRI if not improving.   After informed written consent, patient was seated on exam table. Left knee was prepped with alcohol swab and utilizing anteromedial approach, patient's left knee was injected intraarticularly with 3:1 marcaine: depomedrol. Patient tolerated the procedure well without immediate complications.

## 2013-11-24 NOTE — Assessment & Plan Note (Signed)
radiographs negative. Concerning for medial meniscal tear.  Did well following injection though pain has recurred.  Repeat intraarticular injection given today.  Consider MRI if not improving.   After informed written consent, patient was seated on exam table. Left knee was prepped with alcohol swab and utilizing anteromedial approach, patient's left knee was injected intraarticularly with 3:1 marcaine: depomedrol. Patient tolerated the procedure well without immediate complications.

## 2013-11-24 NOTE — Assessment & Plan Note (Signed)
radiographs negative for fracture.  Consistent with sprain.  Icing, nsaids, elevation, ASO.  Start motion exercises.  F/u in 2 weeks for reevaluation.

## 2013-12-06 ENCOUNTER — Ambulatory Visit: Payer: BC Managed Care – PPO | Admitting: Family Medicine

## 2015-07-04 ENCOUNTER — Ambulatory Visit (INDEPENDENT_AMBULATORY_CARE_PROVIDER_SITE_OTHER): Payer: BLUE CROSS/BLUE SHIELD | Admitting: Family Medicine

## 2015-07-04 ENCOUNTER — Ambulatory Visit (HOSPITAL_BASED_OUTPATIENT_CLINIC_OR_DEPARTMENT_OTHER)
Admission: RE | Admit: 2015-07-04 | Discharge: 2015-07-04 | Disposition: A | Discharge status: Home / Self Care | Payer: BLUE CROSS/BLUE SHIELD | Source: Ambulatory Visit | Attending: Family Medicine | Admitting: Family Medicine

## 2015-07-04 VITALS — BP 129/84 | HR 85 | Ht 68.0 in | Wt 200.0 lb

## 2015-07-04 DIAGNOSIS — S300XXA Contusion of lower back and pelvis, initial encounter: Secondary | ICD-10-CM | POA: Diagnosis not present

## 2015-07-04 DIAGNOSIS — M533 Sacrococcygeal disorders, not elsewhere classified: Secondary | ICD-10-CM | POA: Insufficient documentation

## 2015-07-04 DIAGNOSIS — M25562 Pain in left knee: Secondary | ICD-10-CM | POA: Diagnosis not present

## 2015-07-04 MED ORDER — DICLOFENAC SODIUM 75 MG PO TBEC: 75.0000 mg | DELAYED_RELEASE_TABLET | Freq: Two times a day (BID) | ORAL | Status: DC

## 2015-07-04 MED ORDER — HYDROCODONE-ACETAMINOPHEN 5-325 MG PO TABS: 1.0000 | ORAL_TABLET | Freq: Four times a day (QID) | ORAL | Status: DC | PRN

## 2015-07-04 NOTE — Patient Instructions (Signed)
You have a coccyx contusion. Use a donut pad or innertube when sitting to help unload the area. Icing 15 minutes at a time 3-4 times a day. Diclofenac 75mg  twice a day with food for pain and inflammation. Hydrocodone as needed for severe pain (no driving on this medicine). These usually take 2-6 weeks to completely resolve. There's not much else you can do to accelerate the healing of this.

## 2015-07-05 ENCOUNTER — Encounter: Payer: Self-pay | Admitting: Family Medicine

## 2015-07-05 DIAGNOSIS — S300XXA Contusion of lower back and pelvis, initial encounter: Secondary | ICD-10-CM | POA: Insufficient documentation

## 2015-07-05 NOTE — Progress Notes (Signed)
PCP: No primary care provider on file.  Subjective:   HPI: Patient is a 40 y.o. female here for tailbone injury.  Patient reports on 5/8 she was letting dogs outside, slipped on the grass and landed directly on her tailbone. Has had pain sitting since then, getting up from seated position. Ok with walking. Worse with sleeping. Taking ibuprofen. Pain is 0/10 at rest, up to 10/10 and sharp with sitting and getting up. No bowel/bladder dysfunction. Some radiation into both hips only.  Past Medical History  Diagnosis Date  . No pertinent past medical history   . Endometriosis     Current Outpatient Prescriptions on File Prior to Visit  Medication Sig Dispense Refill  . chlorhexidine (PERIDEX) 0.12 % solution     . Multiple Vitamin (MULTIVITAMIN WITH MINERALS) TABS Take 1 tablet by mouth daily.    . Norgestimate-Ethinyl Estradiol Triphasic (ORTHO TRI-CYCLEN LO) 0.18/0.215/0.25 MG-25 MCG tab Take 1 tablet by mouth daily.    . ondansetron (ZOFRAN ODT) 4 MG disintegrating tablet Take 1 tablet (4 mg total) by mouth every 8 (eight) hours as needed for nausea. 20 tablet 0   No current facility-administered medications on file prior to visit.    Past Surgical History  Procedure Laterality Date  . No past surgeries    . Laparoscopic hysterectomy  02/05/2012    Procedure: HYSTERECTOMY TOTAL LAPAROSCOPIC;  Surgeon: Jolayne Haines, MD;  Location: [facility];  Service: Gynecology;  Laterality: N/A;  . Cystoscopy  02/05/2012    Procedure: CYSTOSCOPY;  Surgeon: Jolayne Haines, MD;  Location: [facility];  Service: Gynecology;  Laterality: N/A;  . Abdominal hysterectomy      No Known Allergies  Social History   Social History  . Marital Status: Married    Spouse Name: N/A  . Number of Children: N/A  . Years of Education: N/A   Occupational History  . Not on file.   Social History Main Topics  . Smoking status: Never Smoker   . Smokeless tobacco: Not on file  . Alcohol Use: No  . Drug  Use: No  . Sexual Activity: Yes   Other Topics Concern  . Not on file   Social History Narrative    Family History  Problem Relation Age of Onset  . Sudden death Father   . Heart attack Father   . Diabetes Neg Hx   . Hyperlipidemia Neg Hx   . Hypertension Neg Hx     BP 129/84 mmHg  Pulse 85  Ht 5\' 8"  (1.727 m)  Wt 200 lb (90.719 kg)  BMI 30.42 kg/m2  LMP 01/21/2012  Review of Systems: See HPI above.    Objective:  Physical Exam:  Gen: NAD, comfortable in exam room  Back: No gross deformity, scoliosis. TTP lower sacrum, coccyx.  No other midline or bony TTP.  No paraspinal tenderness. FROM. Strength LEs 5/5 all muscle groups.   Negative SLRs. Sensation intact to light touch bilaterally. Negative logroll bilateral hips    Assessment & Plan:  1. Coccyx contusion - independently reviewed radiographs and no evidence fracture.  Reassured patient.  Donut pad provided.  Icing, diclofenac with hydrocodone as needed.  Expect 2-6 weeks to resolve.  F/u prn.

## 2015-07-05 NOTE — Assessment & Plan Note (Signed)
independently reviewed radiographs and no evidence fracture.  Reassured patient.  Donut pad provided.  Icing, diclofenac with hydrocodone as needed.  Expect 2-6 weeks to resolve.  F/u prn.

## 2016-06-19 ENCOUNTER — Ambulatory Visit: Payer: BLUE CROSS/BLUE SHIELD | Admitting: Family Medicine

## 2017-06-01 ENCOUNTER — Encounter (HOSPITAL_BASED_OUTPATIENT_CLINIC_OR_DEPARTMENT_OTHER): Payer: Self-pay | Admitting: Emergency Medicine

## 2017-06-01 ENCOUNTER — Emergency Department (HOSPITAL_BASED_OUTPATIENT_CLINIC_OR_DEPARTMENT_OTHER)
Admission: EM | Admit: 2017-06-01 | Discharge: 2017-06-02 | Disposition: A | Discharge status: Home / Self Care | Payer: BLUE CROSS/BLUE SHIELD | Attending: Emergency Medicine | Admitting: Emergency Medicine

## 2017-06-01 ENCOUNTER — Other Ambulatory Visit: Payer: Self-pay

## 2017-06-01 DIAGNOSIS — Z79899 Other long term (current) drug therapy: Secondary | ICD-10-CM | POA: Insufficient documentation

## 2017-06-01 DIAGNOSIS — R1013 Epigastric pain: Secondary | ICD-10-CM | POA: Diagnosis not present

## 2017-06-01 DIAGNOSIS — R1012 Left upper quadrant pain: Secondary | ICD-10-CM | POA: Diagnosis not present

## 2017-06-01 DIAGNOSIS — R197 Diarrhea, unspecified: Secondary | ICD-10-CM | POA: Diagnosis not present

## 2017-06-01 DIAGNOSIS — R112 Nausea with vomiting, unspecified: Secondary | ICD-10-CM | POA: Diagnosis not present

## 2017-06-01 DIAGNOSIS — R1010 Upper abdominal pain, unspecified: Secondary | ICD-10-CM | POA: Diagnosis present

## 2017-06-01 LAB — URINALYSIS, ROUTINE W REFLEX MICROSCOPIC
Bilirubin Urine: NEGATIVE
Glucose, UA: NEGATIVE mg/dL
Hgb urine dipstick: NEGATIVE
Ketones, ur: NEGATIVE mg/dL
LEUKOCYTES UA: NEGATIVE
NITRITE: NEGATIVE
PH: 7 (ref 5.0–8.0)
Protein, ur: NEGATIVE mg/dL
SPECIFIC GRAVITY, URINE: 1.015 (ref 1.005–1.030)

## 2017-06-01 LAB — COMPREHENSIVE METABOLIC PANEL
ALBUMIN: 4 g/dL (ref 3.5–5.0)
ALK PHOS: 60 U/L (ref 38–126)
ALT: 31 U/L (ref 14–54)
ANION GAP: 9 (ref 5–15)
AST: 23 U/L (ref 15–41)
BUN: 20 mg/dL (ref 6–20)
CALCIUM: 9.1 mg/dL (ref 8.9–10.3)
CHLORIDE: 106 mmol/L (ref 101–111)
CO2: 22 mmol/L (ref 22–32)
CREATININE: 0.65 mg/dL (ref 0.44–1.00)
GFR calc Af Amer: >60 mL/min (ref >60)
GFR calc non Af Amer: >60 mL/min (ref >60)
GLUCOSE: 102 mg/dL — AB (ref 65–99)
Potassium: 4 mmol/L (ref 3.5–5.1)
SODIUM: 137 mmol/L (ref 135–145)
Total Bilirubin: 0.3 mg/dL (ref 0.3–1.2)
Total Protein: 7.4 g/dL (ref 6.5–8.1)

## 2017-06-01 LAB — CBC
HCT: 40.7 % (ref 36.0–46.0)
HEMOGLOBIN: 13.3 g/dL (ref 12.0–15.0)
MCH: 30.2 pg (ref 26.0–34.0)
MCHC: 32.7 g/dL (ref 30.0–36.0)
MCV: 92.5 fL (ref 78.0–100.0)
PLATELETS: 324 10*3/uL (ref 150–400)
RBC: 4.4 MIL/uL (ref 3.87–5.11)
RDW: 13.9 % (ref 11.5–15.5)
WBC: 12.1 10*3/uL — ABNORMAL HIGH (ref 4.0–10.5)

## 2017-06-01 LAB — LIPASE, BLOOD: Lipase: 29 U/L (ref 11–51)

## 2017-06-01 LAB — TROPONIN I: Troponin I: <0.03 ng/mL (ref <0.03)

## 2017-06-01 MED ORDER — SUCRALFATE 1 GM/10ML PO SUSP
1.0000 g | Freq: Once | ORAL | Status: AC
  Administered 2017-06-01: 1 g via ORAL
  Filled 2017-06-01: qty 10

## 2017-06-01 MED ORDER — ONDANSETRON HCL 4 MG/2ML IJ SOLN
4.0000 mg | Freq: Once | INTRAMUSCULAR | Status: AC
  Administered 2017-06-01: 4 mg via INTRAVENOUS
  Filled 2017-06-01: qty 2

## 2017-06-01 MED ORDER — FAMOTIDINE IN NACL 20-0.9 MG/50ML-% IV SOLN: 20.0000 mg | Freq: Two times a day (BID) | INTRAVENOUS | Status: DC

## 2017-06-01 MED ORDER — ONDANSETRON 4 MG PO TBDP
4.0000 mg | ORAL_TABLET | Freq: Once | ORAL | Status: AC | PRN
  Administered 2017-06-01: 4 mg via ORAL
  Filled 2017-06-01: qty 1

## 2017-06-01 MED ORDER — PANTOPRAZOLE SODIUM 40 MG IV SOLR
INTRAVENOUS | Status: AC
  Administered 2017-06-01: 40 mg
  Filled 2017-06-01: qty 40

## 2017-06-01 MED ORDER — FENTANYL CITRATE (PF) 100 MCG/2ML IJ SOLN
50.0000 ug | Freq: Once | INTRAMUSCULAR | Status: AC
  Administered 2017-06-01: 50 ug via INTRAVENOUS
  Filled 2017-06-01: qty 2

## 2017-06-01 NOTE — ED Provider Notes (Signed)
[facility] Provider Note: [name] Spurling, MD, FACEP  CSN: [id number] MRN: [id number] ARRIVAL: 06/01/17 at 2036 ROOM: [place]  Abdominal Pain   HISTORY OF PRESENT ILLNESS  06/01/17 10:56 PM [name] [name] is a 42 y.o. female with abdominal pain, nausea, vomiting and diarrhea since this morning.  She describes the abdominal pain as dull and constant.  It is present across her upper abdomen, primarily on the left.  There is an associated intermittent sharp pain that comes in waves and lasts for about a minute.  She rates the constant dull pain is a 7 out of 10.  She rates the sharp intermittent pain as a 10 out of 10.  The pain is somewhat worse with palpation of the left upper quadrant.  She estimates she has vomited 8 times today, most recently about 30 minutes after being given Zofran on arrival.  She has had several episodes of diarrhea as well.  She denies fever, chest pain or shortness of breath.   Past Medical History:  Diagnosis Date  . Endometriosis   . No pertinent past medical history     Past Surgical History:  Procedure Laterality Date  . ABDOMINAL HYSTERECTOMY    . CYSTOSCOPY  02/05/2012   Procedure: CYSTOSCOPY;  Surgeon: Jolayne Haines, MD;  Location: [facility];  Service: Gynecology;  Laterality: N/A;  . LAPAROSCOPIC HYSTERECTOMY  02/05/2012   Procedure: HYSTERECTOMY TOTAL LAPAROSCOPIC;  Surgeon: Jolayne Haines, MD;  Location: [facility];  Service: Gynecology;  Laterality: N/A;  . NO PAST SURGERIES      Family History  Problem Relation Age of Onset  . Sudden death Father   . Heart attack Father   . Diabetes Neg Hx   . Hyperlipidemia Neg Hx   . Hypertension Neg Hx     Social History   Tobacco Use  . Smoking status: Never Smoker  . Smokeless tobacco: Never Used  Substance Use Topics  . Alcohol use: Yes  . Drug use: No    Prior to Admission medications   Medication Sig Start Date End Date Taking? Authorizing Provider    chlorhexidine (PERIDEX) 0.12 % solution  02/18/13   [provider]  diclofenac (VOLTAREN) 75 MG EC tablet Take 1 tablet (75 mg total) by mouth 2 (two) times daily. 07/04/15   Hudnall, Sharyn Lull, MD  HYDROcodone-acetaminophen (NORCO/VICODIN) 5-325 MG tablet Take 1 tablet by mouth every 6 (six) hours as needed. 07/04/15   Dene Gentry, MD  Multiple Vitamin (MULTIVITAMIN WITH MINERALS) TABS Take 1 tablet by mouth daily.    [provider]  Norgestimate-Ethinyl Estradiol Triphasic (ORTHO TRI-CYCLEN LO) 0.18/0.215/0.25 MG-25 MCG tab Take 1 tablet by mouth daily.    [provider]  ondansetron (ZOFRAN ODT) 4 MG disintegrating tablet Take 1 tablet (4 mg total) by mouth every 8 (eight) hours as needed for nausea. 01/10/12   Leonard Schwartz, MD    Allergies Patient has no known allergies.   REVIEW OF SYSTEMS  Negative except as noted here or in the History of Present Illness.   PHYSICAL EXAMINATION  Initial Vital Signs Blood pressure (!) 149/84, pulse 66, temperature 99.5 F (37.5 C), temperature source Oral, resp. rate 16, height 5\' 7"  (1.702 m), weight 99.8 kg (220 lb), last menstrual period 01/21/2012, SpO2 100 %.  Examination General: Well-developed, well-nourished female in no acute distress; appearance consistent with age of record HENT: normocephalic; atraumatic Eyes: pupils equal, round and reactive to light; extraocular muscles  intact Neck: supple Heart: regular rate and rhythm Lungs: clear to auscultation bilaterally Abdomen: soft; nondistended; epigastric and left upper quadrant tenderness; no masses or hepatosplenomegaly; bowel sounds present Extremities: No deformity; full range of motion; pulses normal Neurologic: Awake, alert and oriented; motor function intact in all extremities and symmetric; no facial droop Skin: Warm and dry Psychiatric: Normal mood and affect   RESULTS  Summary of this visit's results, reviewed by myself:   EKG  Interpretation  Date/Time:  Monday June 01 2017 20:49:57 EDT Ventricular Rate:  62 PR Interval:  132 QRS Duration: 88 QT Interval:  404 QTC Calculation: 410 R Axis:   62 Text Interpretation:  Normal sinus rhythm Normal ECG Confirmed by Quintella Reichert [phone]) on 06/01/2017 8:55:02 PM      Laboratory Studies: Results for orders placed or performed during the hospital encounter of 06/01/17 (from the past 24 hour(s))  Lipase, blood     Status: None   Collection Time: 06/01/17  9:29 PM  Result Value Ref Range   Lipase 29 11 - 51 U/L  Comprehensive metabolic panel     Status: Abnormal   Collection Time: 06/01/17  9:29 PM  Result Value Ref Range   Sodium 137 135 - 145 mmol/L   Potassium 4.0 3.5 - 5.1 mmol/L   Chloride 106 101 - 111 mmol/L   CO2 22 22 - 32 mmol/L   Glucose, Bld 102 (H) 65 - 99 mg/dL   BUN 20 6 - 20 mg/dL   Creatinine, Ser 0.65 0.44 - 1.00 mg/dL   Calcium 9.1 8.9 - 10.3 mg/dL   Total Protein 7.4 6.5 - 8.1 g/dL   Albumin 4.0 3.5 - 5.0 g/dL   AST 23 15 - 41 U/L   ALT 31 14 - 54 U/L   Alkaline Phosphatase 60 38 - 126 U/L   Total Bilirubin 0.3 0.3 - 1.2 mg/dL   GFR calc non Af Amer >60 >60 mL/min   GFR calc Af Amer >60 >60 mL/min   Anion gap 9 5 - 15  CBC     Status: Abnormal   Collection Time: 06/01/17  9:29 PM  Result Value Ref Range   WBC 12.1 (H) 4.0 - 10.5 K/uL   RBC 4.40 3.87 - 5.11 MIL/uL   Hemoglobin 13.3 12.0 - 15.0 g/dL   HCT 40.7 36.0 - 46.0 %   MCV 92.5 78.0 - 100.0 fL   MCH 30.2 26.0 - 34.0 pg   MCHC 32.7 30.0 - 36.0 g/dL   RDW 13.9 11.5 - 15.5 %   Platelets 324 150 - 400 K/uL  Troponin I     Status: None   Collection Time: 06/01/17  9:29 PM  Result Value Ref Range   Troponin I <0.03 <0.03 ng/mL  Urinalysis, Routine w reflex microscopic     Status: None   Collection Time: 06/01/17  9:42 PM  Result Value Ref Range   Color, Urine YELLOW YELLOW   APPearance CLEAR CLEAR   Specific Gravity, Urine 1.015 1.005 - 1.030   pH 7.0 5.0 - 8.0    Glucose, UA NEGATIVE NEGATIVE mg/dL   Hgb urine dipstick NEGATIVE NEGATIVE   Bilirubin Urine NEGATIVE NEGATIVE   Ketones, ur NEGATIVE NEGATIVE mg/dL   Protein, ur NEGATIVE NEGATIVE mg/dL   Nitrite NEGATIVE NEGATIVE   Leukocytes, UA NEGATIVE NEGATIVE   Imaging Studies: No results found.  ED COURSE  Nursing notes and initial vitals signs, including pulse oximetry, reviewed.  Vitals:   06/01/17 2238 06/01/17  2300 06/01/17 2330 06/02/17 0000  BP: (!) 149/84 133/83 127/74 117/74  Pulse: 66 63 65 62  Resp: 16     Temp:      TempSrc:      SpO2: 100% 100% 95% 96%  Weight:      Height:       12:33 AM She is significantly improved after IV Protonix, fentanyl and oral Carafate.  The nature of her pain is suggestive of gastritis or ulcer disease.  We will treat her for this and refer to gastroenterology.  It may also represent an acute viral illness.  PROCEDURES    ED DIAGNOSES     ICD-10-CM   1. Epigastric pain R10.13   2. Nausea and vomiting in adult R11.2        Shanon Rosser, MD 06/02/17 [phone]

## 2017-06-01 NOTE — ED Triage Notes (Signed)
Upper abd pain under L breast since yesterday. States it is sharp. Also c/o vomiting.

## 2017-06-02 MED ORDER — SUCRALFATE 1 G PO TABS
1.0000 g | ORAL_TABLET | Freq: Three times a day (TID) | ORAL | 0 refills | Status: DC
Start: 2017-06-02 — End: 2022-08-08

## 2017-06-02 MED ORDER — ONDANSETRON 8 MG PO TBDP: 8.0000 mg | ORAL_TABLET | Freq: Three times a day (TID) | ORAL | 0 refills | Status: DC | PRN

## 2017-06-02 MED ORDER — OMEPRAZOLE 20 MG PO CPDR: DELAYED_RELEASE_CAPSULE | ORAL | 0 refills | Status: DC

## 2018-07-14 ENCOUNTER — Other Ambulatory Visit: Payer: Self-pay

## 2018-07-14 ENCOUNTER — Ambulatory Visit: Payer: Managed Care, Other (non HMO) | Admitting: Family Medicine

## 2018-07-14 ENCOUNTER — Ambulatory Visit (HOSPITAL_BASED_OUTPATIENT_CLINIC_OR_DEPARTMENT_OTHER)
Admission: RE | Admit: 2018-07-14 | Discharge: 2018-07-14 | Disposition: A | Discharge status: Home / Self Care | Payer: Managed Care, Other (non HMO) | Source: Ambulatory Visit | Attending: Family Medicine | Admitting: Family Medicine

## 2018-07-14 ENCOUNTER — Encounter: Payer: Self-pay | Admitting: Family Medicine

## 2018-07-14 VITALS — BP 148/94 | HR 79 | Ht 66.0 in | Wt 220.0 lb

## 2018-07-14 DIAGNOSIS — S4992XA Unspecified injury of left shoulder and upper arm, initial encounter: Secondary | ICD-10-CM | POA: Diagnosis not present

## 2018-07-14 MED ORDER — HYDROCODONE-ACETAMINOPHEN 5-325 MG PO TABS: 1.0000 | ORAL_TABLET | Freq: Two times a day (BID) | ORAL | 0 refills | Status: DC | PRN

## 2018-07-14 MED FILL — HYDROCODON-APAP 5-325: 5-325 | 5 days supply | Qty: 10 | Fill #0

## 2018-07-14 NOTE — Progress Notes (Signed)
PCP: Patient, No Pcp Per  Subjective:   HPI: Patient is a 43 y.o. female here for left shoulder injury.  Patient reports on 5/21 she slipped in her home and fell to the left side she believes onto her elbow. She denies any pain initially until about 2 days later she had some soreness in the superior and posterior aspect of the left shoulder. Pain is throbbing and up to 10 out of 10 level especially at nighttime. She has been taking Tylenol with minimal relief. She states she has had decreased motion in the shoulder since a fracture in 2009 though her pain improved from this. No skin changes or numbness.  Past Medical History:  Diagnosis Date  . Endometriosis   . No pertinent past medical history     Current Outpatient Medications on File Prior to Visit  Medication Sig Dispense Refill  . Multiple Vitamin (MULTIVITAMIN WITH MINERALS) TABS Take 1 tablet by mouth daily.    . Norgestimate-Ethinyl Estradiol Triphasic (ORTHO TRI-CYCLEN LO) 0.18/0.215/0.25 MG-25 MCG tab Take 1 tablet by mouth daily.    Marland Kitchen omeprazole (PRILOSEC) 20 MG capsule Take 1 capsule daily at least 30 minutes before first dose of Carafate. 15 capsule 0  . ondansetron (ZOFRAN ODT) 8 MG disintegrating tablet Take 1 tablet (8 mg total) by mouth every 8 (eight) hours as needed for nausea or vomiting. 10 tablet 0  . sucralfate (CARAFATE) 1 g tablet Take 1 tablet (1 g total) by mouth 4 (four) times daily -  with meals and at bedtime. 20 tablet 0   No current facility-administered medications on file prior to visit.     Past Surgical History:  Procedure Laterality Date  . ABDOMINAL HYSTERECTOMY    . CYSTOSCOPY  02/05/2012   Procedure: CYSTOSCOPY;  Surgeon: Jolayne Haines, MD;  Location: [facility];  Service: Gynecology;  Laterality: N/A;  . LAPAROSCOPIC HYSTERECTOMY  02/05/2012   Procedure: HYSTERECTOMY TOTAL LAPAROSCOPIC;  Surgeon: Jolayne Haines, MD;  Location: [facility];  Service: Gynecology;  Laterality: N/A;  . NO PAST  SURGERIES      No Known Allergies  Social History   Socioeconomic History  . Marital status: Married    Spouse name: Not on file  . Number of children: Not on file  . Years of education: Not on file  . Highest education level: Not on file  Occupational History  . Not on file  Social Needs  . Financial resource strain: Not on file  . Food insecurity:    Worry: Not on file    Inability: Not on file  . Transportation needs:    Medical: Not on file    Non-medical: Not on file  Tobacco Use  . Smoking status: Never Smoker  . Smokeless tobacco: Never Used  Substance and Sexual Activity  . Alcohol use: Yes  . Drug use: No  . Sexual activity: Yes    Birth control/protection: Surgical  Lifestyle  . Physical activity:    Days per week: Not on file    Minutes per session: Not on file  . Stress: Not on file  Relationships  . Social connections:    Talks on phone: Not on file    Gets together: Not on file    Attends religious service: Not on file    Active member of club or organization: Not on file    Attends meetings of clubs or organizations: Not on file    Relationship status: Not on file  . Intimate partner  violence:    Fear of current or ex partner: Not on file    Emotionally abused: Not on file    Physically abused: Not on file    Forced sexual activity: Not on file  Other Topics Concern  . Not on file  Social History Narrative  . Not on file    Family History  Problem Relation Age of Onset  . Sudden death Father   . Heart attack Father   . Diabetes Neg Hx   . Hyperlipidemia Neg Hx   . Hypertension Neg Hx     BP (!) 148/94   Pulse 79   Ht 5\' 6"  (1.676 m)   Wt 220 lb (99.8 kg)   LMP 01/21/2012   BMI 35.51 kg/m   Review of Systems: See HPI above.     Objective:  Physical Exam:  Gen: NAD, comfortable in exam room  Left shoulder:  No swelling, ecchymoses.  No gross deformity. TTP AC joint, lateral aspect of rotator cuff posteriorly, scapular  spine. Lacks only 20 degrees in all planes with painful arc. Negative Hawkins, Neers. Negative Yergasons. Strength 5/5 with empty can and resisted internal/external rotation. NV intact distally.  Right shoulder: No swelling, ecchymoses.  No gross deformity. No TTP. FROM. Strength 5/5 with empty can and resisted internal/external rotation. NV intact distally.   Assessment & Plan:  1. Left shoulder injury - independently reviewed radiographs and no evidence fracture.  2/2 sprain of AC joint, strain of rotator cuff.  Icing, aleve, tylenol, topical medications reviewed.  Norco if needed for severe pain.  Motion exercises.  Advance to theraband strengthening exercises as tolerated.  F/u in 2 weeks.

## 2018-07-14 NOTE — Patient Instructions (Signed)
You have sprained your [facility] joint and strained your rotator cuff. Ice the area 3-4 times a day for 15 minutes at a time Aleve 2 tabs twice a day with food for pain and inflammation. Tylenol 1-2 tabs three times a day if needed for pain. Salon pas patches, biofreeze topically can help with the pain. Norco as needed for severe pain - this has tylenol in it so don't take tylenol with it. Range of motion exercises when tolerated (arm circles, wall walking, pendulum) 3 sets of 10 once a day. When able to do these range of motion exercises comfortably, you can advance to rotator cuff and scapular stabilizer strengthening/exercises with theraband. Follow up with me in 2 weeks.

## 2018-07-26 ENCOUNTER — Ambulatory Visit: Payer: Managed Care, Other (non HMO) | Admitting: Family Medicine

## 2019-01-26 ENCOUNTER — Ambulatory Visit: Payer: Managed Care, Other (non HMO) | Admitting: Family Medicine

## 2019-06-13 ENCOUNTER — Ambulatory Visit
Admission: RE | Admit: 2019-06-13 | Discharge: 2019-06-13 | Disposition: A | Discharge status: Home / Self Care | Payer: Managed Care, Other (non HMO) | Source: Ambulatory Visit | Attending: Family Medicine | Admitting: Family Medicine

## 2019-06-13 ENCOUNTER — Ambulatory Visit: Payer: Managed Care, Other (non HMO) | Admitting: Family Medicine

## 2019-06-13 ENCOUNTER — Other Ambulatory Visit: Payer: Self-pay

## 2019-06-13 ENCOUNTER — Encounter: Payer: Self-pay | Admitting: Family Medicine

## 2019-06-13 VITALS — BP 120/90 | Ht 66.0 in | Wt 220.0 lb

## 2019-06-13 DIAGNOSIS — M25562 Pain in left knee: Secondary | ICD-10-CM

## 2019-06-13 MED ORDER — METHYLPREDNISOLONE ACETATE 40 MG/ML IJ SUSP
40.0000 mg | Freq: Once | INTRAMUSCULAR | Status: AC
  Administered 2019-06-13: 40 mg via INTRA_ARTICULAR

## 2019-06-13 NOTE — Progress Notes (Signed)
PCP: Patient, No Pcp Per  Subjective:   HPI: Patient is a 44 y.o. female here for left knee pain.  Patient reports she previously had off and on left knee pain. For past few months at least the pain has been more consistent. Feels like knee is 'bone on bone' with crunching of the knee. Gets some posterior medial knee pain with full extension. + night pain. Taking ibuprofen and tylenol without much benefit. Pain is medial and lateral. No skin change. Some swelling anteriorly.  Past Medical History:  Diagnosis Date  . Endometriosis   . No pertinent past medical history     Current Outpatient Medications on File Prior to Visit  Medication Sig Dispense Refill  . HYDROcodone-acetaminophen (NORCO) 5-325 MG tablet Take 1 tablet by mouth 2 (two) times daily as needed for moderate pain. 10 tablet 0  . Multiple Vitamin (MULTIVITAMIN WITH MINERALS) TABS Take 1 tablet by mouth daily.    . Norgestimate-Ethinyl Estradiol Triphasic (ORTHO TRI-CYCLEN LO) 0.18/0.215/0.25 MG-25 MCG tab Take 1 tablet by mouth daily.    Marland Kitchen omeprazole (PRILOSEC) 20 MG capsule Take 1 capsule daily at least 30 minutes before first dose of Carafate. 15 capsule 0  . ondansetron (ZOFRAN ODT) 8 MG disintegrating tablet Take 1 tablet (8 mg total) by mouth every 8 (eight) hours as needed for nausea or vomiting. 10 tablet 0  . sucralfate (CARAFATE) 1 g tablet Take 1 tablet (1 g total) by mouth 4 (four) times daily -  with meals and at bedtime. 20 tablet 0   No current facility-administered medications on file prior to visit.    Past Surgical History:  Procedure Laterality Date  . ABDOMINAL HYSTERECTOMY    . CYSTOSCOPY  02/05/2012   Procedure: CYSTOSCOPY;  Surgeon: Jolayne Haines, MD;  Location: [facility];  Service: Gynecology;  Laterality: N/A;  . LAPAROSCOPIC HYSTERECTOMY  02/05/2012   Procedure: HYSTERECTOMY TOTAL LAPAROSCOPIC;  Surgeon: Jolayne Haines, MD;  Location: [facility];  Service: Gynecology;  Laterality: N/A;  . NO  PAST SURGERIES      No Known Allergies  Social History   Socioeconomic History  . Marital status: Married    Spouse name: Not on file  . Number of children: Not on file  . Years of education: Not on file  . Highest education level: Not on file  Occupational History  . Not on file  Tobacco Use  . Smoking status: Never Smoker  . Smokeless tobacco: Never Used  Substance and Sexual Activity  . Alcohol use: Yes  . Drug use: No  . Sexual activity: Yes    Birth control/protection: Surgical  Other Topics Concern  . Not on file  Social History Narrative  . Not on file   Social Determinants of Health   Financial Resource Strain:   . Difficulty of Paying Living Expenses:   Food Insecurity:   . Worried About Charity fundraiser in the Last Year:   . Arboriculturist in the Last Year:   Transportation Needs:   . Film/video editor (Medical):   Marland Kitchen Lack of Transportation (Non-Medical):   Physical Activity:   . Days of Exercise per Week:   . Minutes of Exercise per Session:   Stress:   . Feeling of Stress :   Social Connections:   . Frequency of Communication with Friends and Family:   . Frequency of Social Gatherings with Friends and Family:   . Attends Religious Services:   . Active Member  of Clubs or Organizations:   . Attends Archivist Meetings:   Marland Kitchen Marital Status:   Intimate Partner Violence:   . Fear of Current or Ex-Partner:   . Emotionally Abused:   Marland Kitchen Physically Abused:   . Sexually Abused:     Family History  Problem Relation Age of Onset  . Sudden death Father   . Heart attack Father   . Diabetes Neg Hx   . Hyperlipidemia Neg Hx   . Hypertension Neg Hx     BP 120/90   Ht 5\' 6"  (1.676 m)   Wt 220 lb (99.8 kg)   LMP 01/21/2012   BMI 35.51 kg/m   Review of Systems: See HPI above.     Objective:  Physical Exam:  Gen: NAD, comfortable in exam room  Left knee: No gross deformity, ecchymoses, swelling. 2+ crepitus TTP anterior aspect  of medial and lateral joint lines. FROM. Negative ant/post drawers. Negative valgus/varus testing. Negative lachmans. Negative mcmurrays, apleys, bounce, patellar apprehension.  Equivocal thessalys. NV intact distally.    Assessment & Plan:  1. Left knee pain - independently reviewed radiographs and noted very  Mild medial arthritis.  Exam is reassuring.  Discussed options - went ahead with steroid injection today.  Tylenol, topical medications, supplements, aleve.  Declined PT for now.  Home exercise program.  F/u in 1 month.  After informed written consent timeout was performed, patient was seated on exam table. Left knee was prepped with alcohol swab and utilizing anteromedial approach, patient's left knee was injected intraarticularly with 3:1 bupivicaine: depomedrol. Patient tolerated the procedure well without immediate complications.

## 2019-06-13 NOTE — Patient Instructions (Signed)
Your pain is due to mild arthritis. These are the different medications you can take for this: Tylenol 500mg  1-2 tabs three times a day for pain. Capsaicin, aspercreme, or biofreeze topically up to four times a day may also help with pain. Some supplements that may help for arthritis: Boswellia extract, curcumin, pycnogenol Aleve 1-2 tabs twice a day with food Cortisone injections are an option - you were given this today. If cortisone injections do not help, there are different types of shots that may help but they take longer to take effect. It's important that you continue to stay active. Straight leg raises, knee extensions 3 sets of 10 once a day (add ankle weight if these become too easy). Consider physical therapy to strengthen muscles around the joint that hurts to take pressure off of the joint itself. Shoe inserts with good arch support may be helpful. Heat or ice 15 minutes at a time 3-4 times a day as needed to help with pain. Water aerobics and cycling with low resistance are the best two types of exercise for arthritis though any exercise is ok as long as it doesn't worsen the pain. Follow up with me in 1 month for reevaluation.

## 2019-09-30 ENCOUNTER — Emergency Department (HOSPITAL_BASED_OUTPATIENT_CLINIC_OR_DEPARTMENT_OTHER): Payer: Managed Care, Other (non HMO)

## 2019-09-30 ENCOUNTER — Encounter (HOSPITAL_BASED_OUTPATIENT_CLINIC_OR_DEPARTMENT_OTHER): Payer: Self-pay | Admitting: Emergency Medicine

## 2019-09-30 ENCOUNTER — Emergency Department (HOSPITAL_BASED_OUTPATIENT_CLINIC_OR_DEPARTMENT_OTHER)
Admission: EM | Admit: 2019-09-30 | Discharge: 2019-09-30 | Disposition: A | Discharge status: Home / Self Care | Payer: Managed Care, Other (non HMO) | Attending: Emergency Medicine | Admitting: Emergency Medicine

## 2019-09-30 ENCOUNTER — Other Ambulatory Visit: Payer: Self-pay

## 2019-09-30 DIAGNOSIS — W010XXA Fall on same level from slipping, tripping and stumbling without subsequent striking against object, initial encounter: Secondary | ICD-10-CM | POA: Insufficient documentation

## 2019-09-30 DIAGNOSIS — Y999 Unspecified external cause status: Secondary | ICD-10-CM | POA: Diagnosis not present

## 2019-09-30 DIAGNOSIS — S80912A Unspecified superficial injury of left knee, initial encounter: Secondary | ICD-10-CM | POA: Diagnosis present

## 2019-09-30 DIAGNOSIS — Y9389 Activity, other specified: Secondary | ICD-10-CM | POA: Insufficient documentation

## 2019-09-30 DIAGNOSIS — S8002XA Contusion of left knee, initial encounter: Secondary | ICD-10-CM | POA: Insufficient documentation

## 2019-09-30 DIAGNOSIS — Y929 Unspecified place or not applicable: Secondary | ICD-10-CM | POA: Diagnosis not present

## 2019-09-30 NOTE — Discharge Instructions (Addendum)
Take over the counter medications as need for pain and swelling.  Apply an ice pack to help reduce the swelling.  Use the crutches and knee immobilizer for comfort.  Follow-up with your orthopedic doctor if your symptoms have not resolved in the next week

## 2019-09-30 NOTE — ED Triage Notes (Signed)
Fell in shower injured left knee and ankle now knee is swollen and painful to walk.

## 2019-09-30 NOTE — ED Provider Notes (Addendum)
[facility] EMERGENCY DEPARTMENT Provider Note   CSN: [id number] Arrival date & time: 09/30/19  0848     History Chief Complaint  Patient presents with  . Fall  . Knee Pain    [name] [name] is a 44 y.o. female.  HPI   Patient presented to the ED for evaluation of left knee pain.  Patient states she is in the tub this morning when she slipped and fell landing on her left knee.  Patient is having bruising and swelling of her left knee.  Hurts to walk.  She did notice a slight bruise to her ankle but does not have any significant pain there.  No numbness or weakness.  No other injuries.  Past Medical History:  Diagnosis Date  . Endometriosis   . No pertinent past medical history     Patient Active Problem List   Diagnosis Date Noted  . Coccyx contusion 07/05/2015  . Left ankle injury 11/24/2013  . Left knee pain 05/03/2013  . Low back pain 09/23/2011    Past Surgical History:  Procedure Laterality Date  . ABDOMINAL HYSTERECTOMY    . CYSTOSCOPY  02/05/2012   Procedure: CYSTOSCOPY;  Surgeon: Jolayne Haines, MD;  Location: [facility];  Service: Gynecology;  Laterality: N/A;  . LAPAROSCOPIC HYSTERECTOMY  02/05/2012   Procedure: HYSTERECTOMY TOTAL LAPAROSCOPIC;  Surgeon: Jolayne Haines, MD;  Location: [facility];  Service: Gynecology;  Laterality: N/A;  . NO PAST SURGERIES       OB History   No obstetric history on file.     Family History  Problem Relation Age of Onset  . Sudden death Father   . Heart attack Father   . Diabetes Neg Hx   . Hyperlipidemia Neg Hx   . Hypertension Neg Hx     Social History   Tobacco Use  . Smoking status: Never Smoker  . Smokeless tobacco: Never Used  Substance Use Topics  . Alcohol use: Yes  . Drug use: No    Home Medications Prior to Admission medications   Medication Sig Start Date End Date Taking? Authorizing Provider  HYDROcodone-acetaminophen (NORCO) 5-325 MG tablet Take 1 tablet by mouth 2 (two) times daily as  needed for moderate pain. 07/14/18   Dene Gentry, MD  Multiple Vitamin (MULTIVITAMIN WITH MINERALS) TABS Take 1 tablet by mouth daily.    [provider]  Norgestimate-Ethinyl Estradiol Triphasic (ORTHO TRI-CYCLEN LO) 0.18/0.215/0.25 MG-25 MCG tab Take 1 tablet by mouth daily.    [provider]  omeprazole (PRILOSEC) 20 MG capsule Take 1 capsule daily at least 30 minutes before first dose of Carafate. 06/02/17   Molpus, John, MD  ondansetron (ZOFRAN ODT) 8 MG disintegrating tablet Take 1 tablet (8 mg total) by mouth every 8 (eight) hours as needed for nausea or vomiting. 06/02/17   Molpus, John, MD  sucralfate (CARAFATE) 1 g tablet Take 1 tablet (1 g total) by mouth 4 (four) times daily -  with meals and at bedtime. 06/02/17   Molpus, Jenny Reichmann, MD    Allergies    Patient has no known allergies.  Review of Systems   Review of Systems  All other systems reviewed and are negative.   Physical Exam Updated Vital Signs BP (!) 145/95 (BP Location: Right Arm)   Pulse 80   Temp 98.5 F (36.9 C) (Oral)   Resp 16   Ht 1.676 m (5\' 6" )   Wt (!) 220 kg   LMP 01/21/2012   SpO2  100%   BMI 78.28 kg/m   Physical Exam Vitals and nursing note reviewed.  Constitutional:      General: She is not in acute distress.    Appearance: She is well-developed.  HENT:     Head: Normocephalic and atraumatic.     Right Ear: External ear normal.     Left Ear: External ear normal.  Eyes:     General: No scleral icterus.       Right eye: No discharge.        Left eye: No discharge.     Conjunctiva/sclera: Conjunctivae normal.  Neck:     Trachea: No tracheal deviation.  Cardiovascular:     Rate and Rhythm: Normal rate.  Pulmonary:     Effort: Pulmonary effort is normal. No respiratory distress.     Breath sounds: No stridor.  Abdominal:     General: There is no distension.  Musculoskeletal:        General: Tenderness present. No swelling or deformity.     Cervical back: Neck supple.       Left knee: Bony tenderness present. Tenderness present. No LCL laxity, MCL laxity, ACL laxity or PCL laxity.Normal pulse.     Left lower leg: No deformity or bony tenderness.     Comments: Contusion noted overlying the patella,  mild tend bruising noted  distal tibia but no significant tenderness  Skin:    General: Skin is warm and dry.     Findings: No rash.  Neurological:     Mental Status: She is alert.     Cranial Nerves: Cranial nerve deficit: no gross deficits.     ED Results / Procedures / Treatments   Labs (all labs ordered are listed, but only abnormal results are displayed) Labs Reviewed - No data to display  EKG None  Radiology DG Knee Complete 4 Views Left  Result Date: 09/30/2019 CLINICAL DATA:  Pain following fall EXAM: LEFT KNEE - COMPLETE 4+ VIEW COMPARISON:  None. FINDINGS: Frontal, lateral, and bilateral oblique views were obtained. There is prepatellar soft tissue swelling. No fracture, dislocation, or joint effusion. Joint spaces appear normal. No erosive change. IMPRESSION: Prepatellar soft tissue swelling. No fracture, dislocation, or joint effusion. No appreciable arthropathy. Electronically Signed   By: Lowella Grip III M.D.   On: 09/30/2019 09:34    Procedures Procedures (including critical care time)  Medications Ordered in ED Medications - No data to display  ED Course  I have reviewed the triage vital signs and the nursing notes.  Pertinent labs & imaging results that were available during my care of the patient were reviewed by me and considered in my medical decision making (see chart for details).    MDM Rules/Calculators/A&P                          Patient has evidence of contusion and swelling over the right patella.  Patient also may have a traumatic bursitis overlying the patella.  X-ray does not show evidence of fracture.  No evidence of patellar quadricep tendon rupture.  Patient is able to straight leg raise.  Discharge home  with crutches and knee immobilizer for supportive care.  Outpatient follow-up with Ortho as needed Final Clinical Impression(s) / ED Diagnoses Final diagnoses:  Contusion of left knee, initial encounter    Rx / DC Orders ED Discharge Orders    None       Dorie Rank, MD 09/30/19 1035  Dorie Rank, MD 09/30/19 [phone]

## 2019-10-17 ENCOUNTER — Ambulatory Visit: Payer: Managed Care, Other (non HMO) | Admitting: Family Medicine

## 2019-10-19 ENCOUNTER — Ambulatory Visit: Payer: Managed Care, Other (non HMO) | Admitting: Family Medicine

## 2019-11-30 ENCOUNTER — Ambulatory Visit: Payer: Managed Care, Other (non HMO) | Admitting: Family Medicine

## 2021-01-12 IMAGING — CR DG KNEE COMPLETE 4+V*L*
4 series · 4 of 4 positions shown · non-contrast
Comparison: None.

CLINICAL DATA: Pain following fall

EXAM:
LEFT KNEE - COMPLETE 4+ VIEW

[t knee ap left]
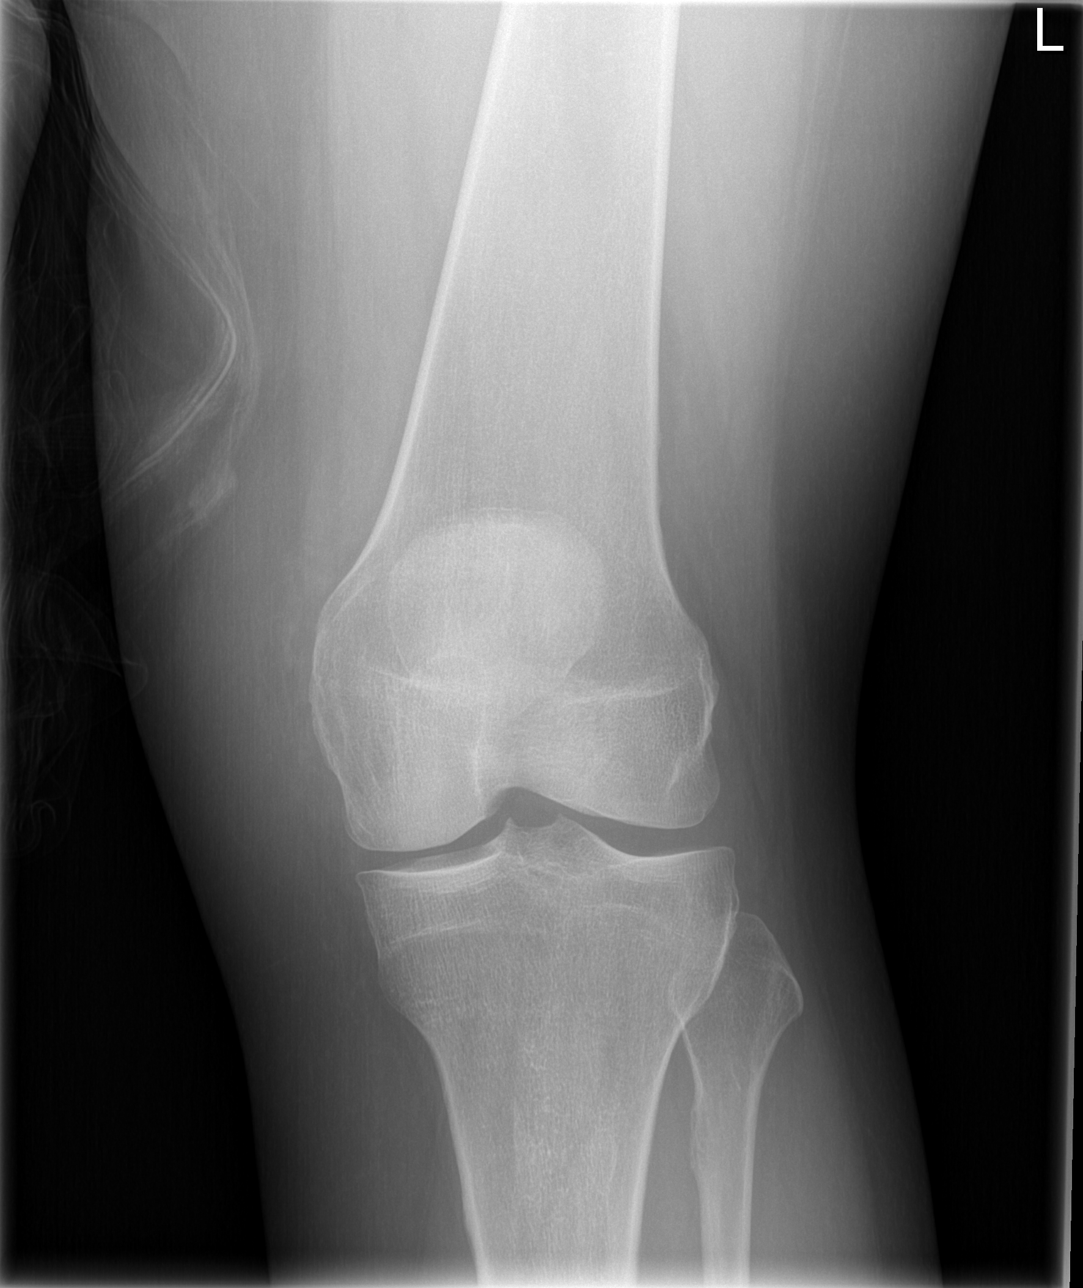

[t knee oblique left (1 of 2)]
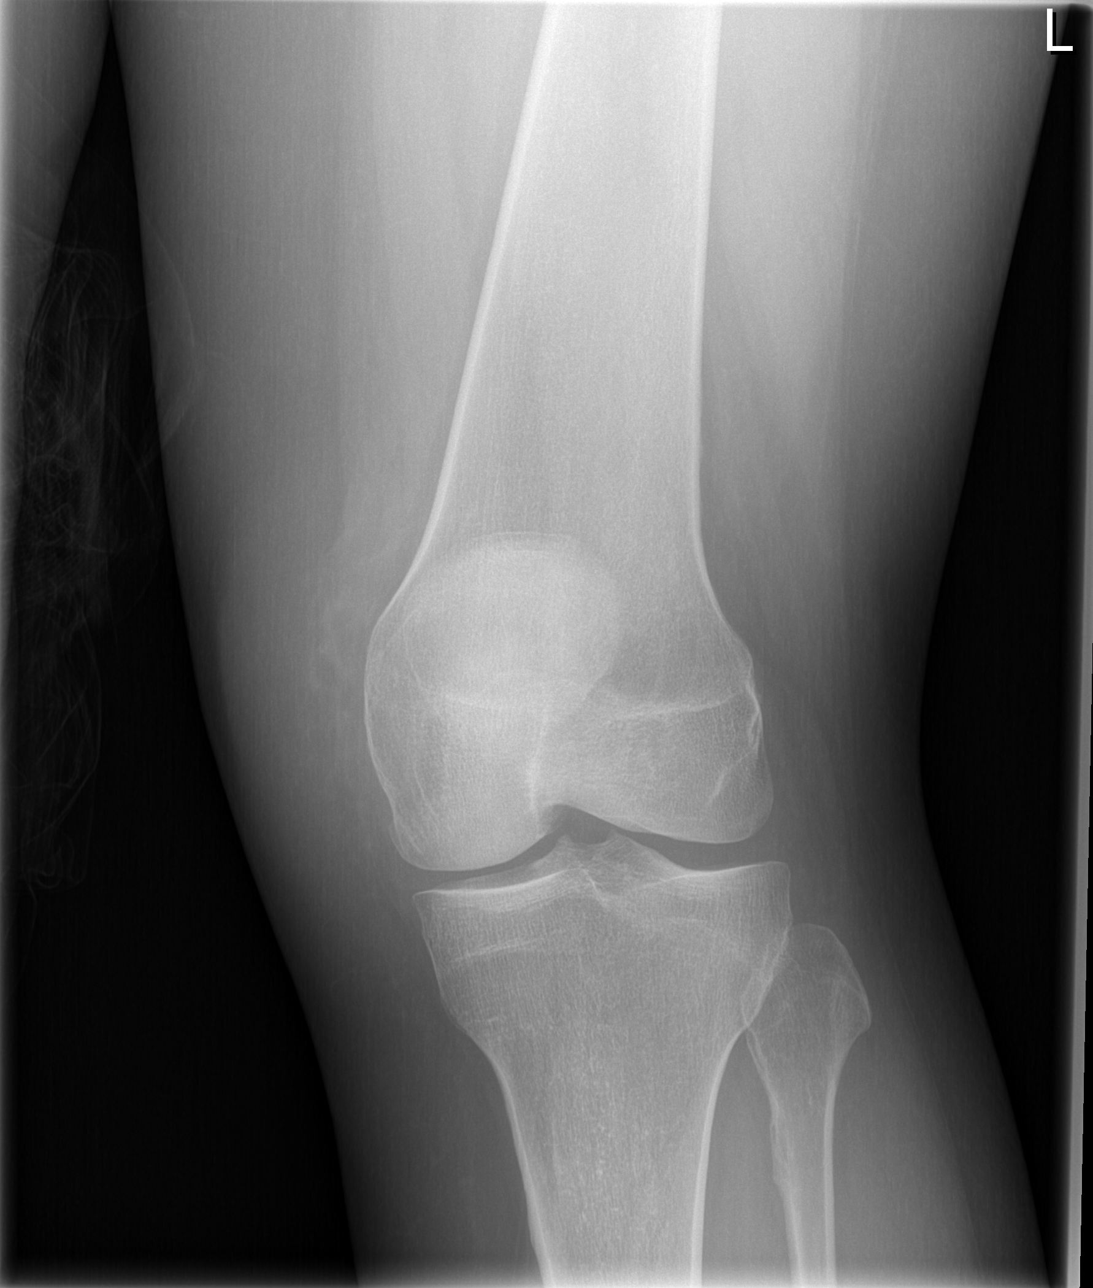

[t knee oblique left (2 of 2)]
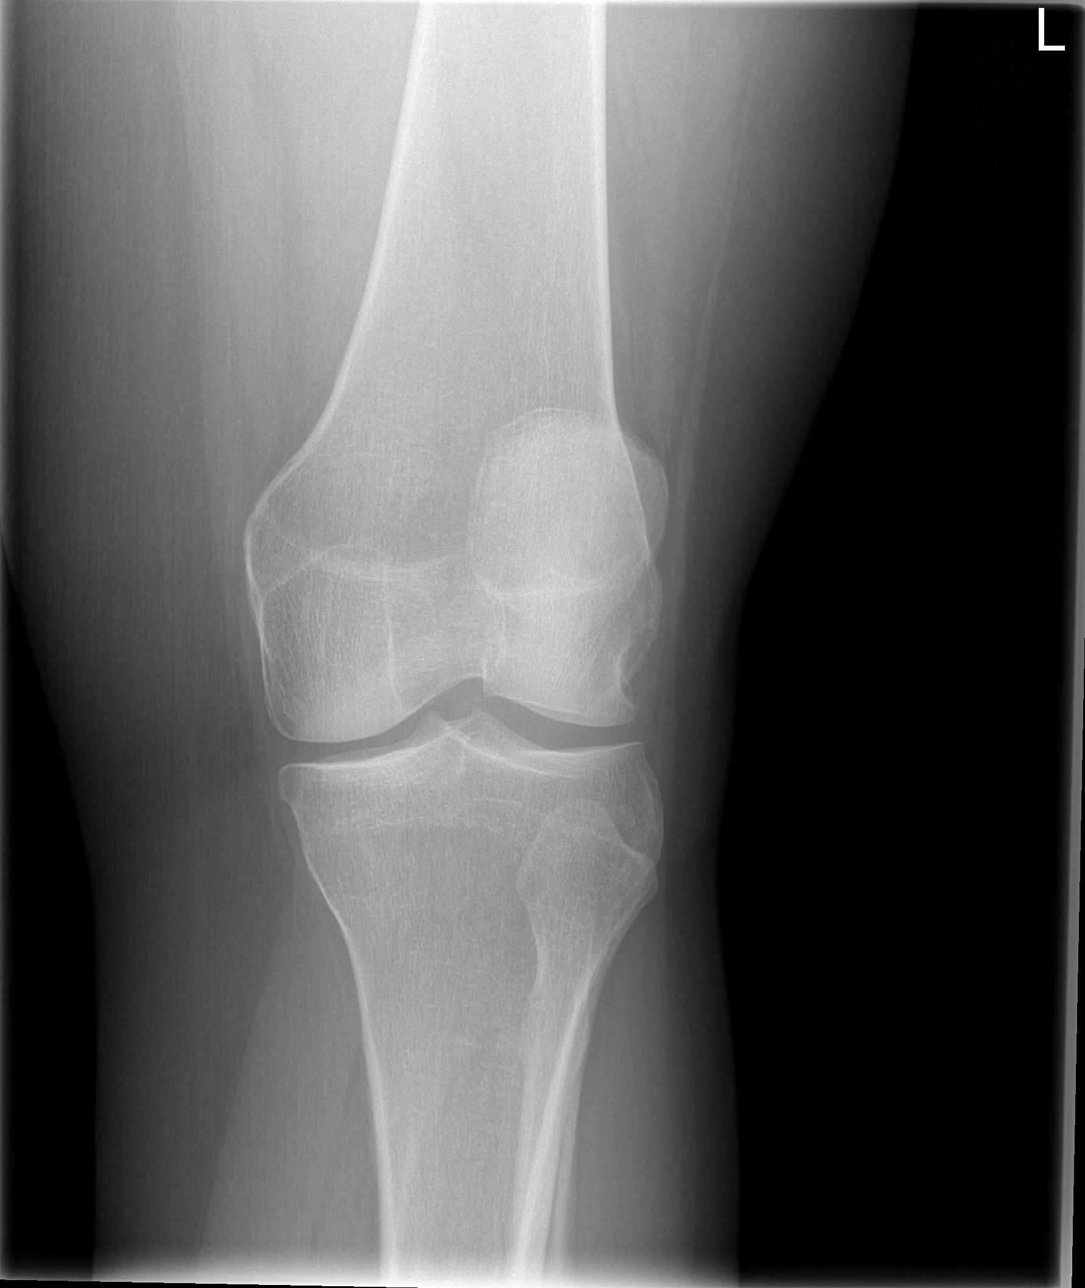

[t knee lat left]
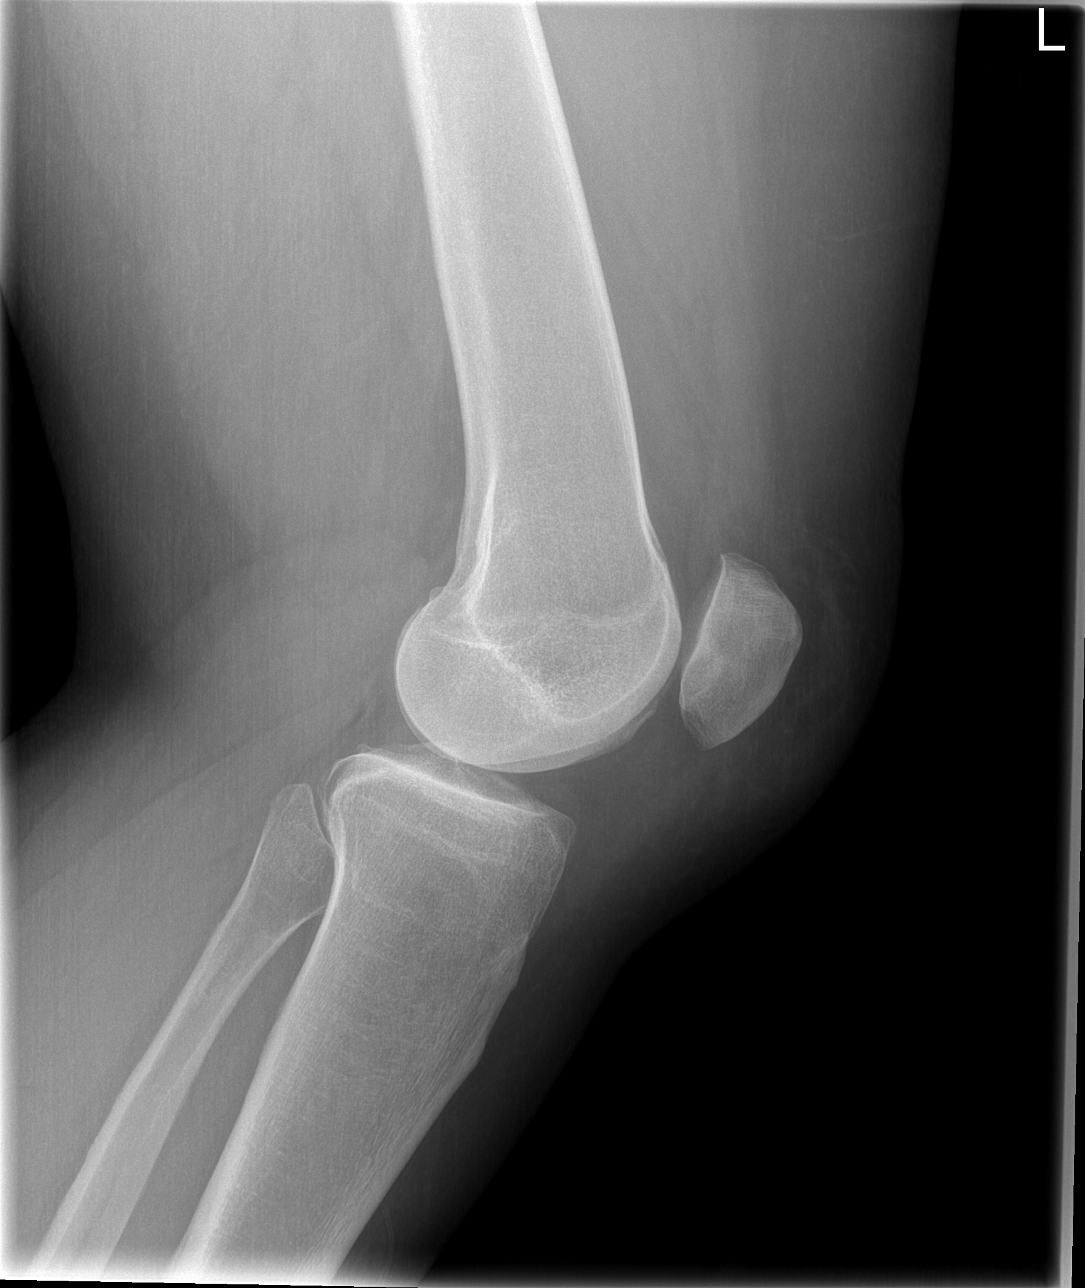

[4 of 4 positions shown; findings below may reference images not displayed]

FINDINGS: Frontal, lateral, and bilateral oblique views were obtained. There
is prepatellar soft tissue swelling. No fracture, dislocation, or
joint effusion. Joint spaces appear normal. No erosive change.
IMPRESSION: Prepatellar soft tissue swelling. No fracture, dislocation, or joint
effusion. No appreciable arthropathy.

## 2022-06-17 DIAGNOSIS — Z6830 Body mass index (BMI) 30.0-30.9, adult: Secondary | ICD-10-CM

## 2022-06-17 DIAGNOSIS — D500 Iron deficiency anemia secondary to blood loss (chronic): Secondary | ICD-10-CM | POA: Diagnosis present

## 2022-06-18 ENCOUNTER — Ambulatory Visit: Payer: Self-pay | Admitting: Surgery

## 2022-07-22 NOTE — Progress Notes (Addendum)
Anesthesia Review:  PCP: Caffie Damme at [facility]  Cardiologist : none  Chest x-ray : EKG : Echo : Stress test: Cardiac Cath :  Activity level: can do a flight of stairs without difficulty  Sleep Study/ CPAP : none  Fasting Blood Sugar :      / Checks Blood Sugar -- times a day:   Blood Thinner/ Instructions /Last Dose: ASA / Instructions/ Last Dose :

## 2022-07-22 NOTE — Patient Instructions (Signed)
SURGICAL WAITING ROOM VISITATION  Patients having surgery or a procedure may have no more than 2 support people in the waiting area - these visitors may rotate.    Children under the age of 46 must have an adult with them who is not the patient.  Due to an increase in RSV and influenza rates and associated hospitalizations, children ages 74 and under may not visit patients in [facility] hospitals.  If the patient needs to stay at the hospital during part of their recovery, the visitor guidelines for inpatient rooms apply. Pre-op nurse will coordinate an appropriate time for 1 support person to accompany patient in pre-op.  This support person may not rotate.    Please refer to the [facility] website for the visitor guidelines for Inpatients (after your surgery is over and you are in a regular room).       Your procedure is scheduled on:  08/08/22    Report to [facility] Main Entrance    Report to admitting at   [phone]   Call this number if you have problems the morning of surgery [phone]   Do not eat food :After Midnight.   After Midnight you may have the following liquids until _ 0430_____ AM  DAY OF SURGERY  Water Non-Citrus Juices (without pulp, NO RED-Apple, White grape, White cranberry) Black Coffee (NO MILK/CREAM OR CREAMERS, sugar ok)  Clear Tea (NO MILK/CREAM OR CREAMERS, sugar ok) regular and decaf                             Plain Jell-O (NO RED)                                           Fruit ices (not with fruit pulp, NO RED)                                     Popsicles (NO RED)                                                               Sports drinks like Gatorade (NO RED)              Drink 2 Ensure/G2 drinks AT 10:00 PM the night before surgery.        The day of surgery:  Drink ONE (1) Pre-Surgery Clear Ensure or G2 at  0430 AM ( have completed by )  the morning of surgery. Drink in one sitting. Do not sip.  This drink was given to you  during your hospital  pre-op appointment visit. Nothing else to drink after completing the  Pre-Surgery Clear Ensure or G2.          If you have questions, please contact your surgeon's office.   Oral Hygiene is also important to reduce your risk of infection.                                    Remember -  BRUSH YOUR TEETH THE MORNING OF SURGERY WITH YOUR REGULAR TOOTHPASTE  DENTURES WILL BE REMOVED PRIOR TO SURGERY PLEASE DO NOT APPLY "Poly grip" OR ADHESIVES!!!   Do NOT smoke after Midnight   Take these medicines the morning of surgery with A SIP OF WATER:  omeprazole   DO NOT TAKE ANY ORAL DIABETIC MEDICATIONS DAY OF YOUR SURGERY  Bring CPAP mask and tubing day of surgery.                              You may not have any metal on your body including hair pins, jewelry, and body piercing             Do not wear make-up, lotions, powders, perfumes/cologne, or deodorant  Do not wear nail polish including gel and S&S, artificial/acrylic nails, or any other type of covering on natural nails including finger and toenails. If you have artificial nails, gel coating, etc. that needs to be removed by a nail salon please have this removed prior to surgery or surgery may need to be canceled/ delayed if the surgeon/ anesthesia feels like they are unable to be safely monitored.   Do not shave  48 hours prior to surgery.               Men may shave face and neck.   Do not bring valuables to the hospital. [facility] IS NOT             RESPONSIBLE   FOR VALUABLES.   Contacts, glasses, dentures or bridgework may not be worn into surgery.   Bring small overnight bag day of surgery.   DO NOT BRING YOUR HOME MEDICATIONS TO THE HOSPITAL. PHARMACY WILL DISPENSE MEDICATIONS LISTED ON YOUR MEDICATION LIST TO YOU DURING YOUR ADMISSION IN THE HOSPITAL!    Patients discharged on the day of surgery will not be allowed to drive home.  Someone NEEDS to stay with you for the first 24 hours after  anesthesia.   Special Instructions: Bring a copy of your healthcare power of attorney and living will documents the day of surgery if you haven't scanned them before.              Please read over the following fact sheets you were given: IF YOU HAVE QUESTIONS ABOUT YOUR PRE-OP INSTRUCTIONS PLEASE CALL [phone]   If you received a COVID test during your pre-op visit  it is requested that you wear a mask when out in public, stay away from anyone that may not be feeling well and notify your surgeon if you develop symptoms. If you test positive for Covid or have been in contact with anyone that has tested positive in the last 10 days please notify you surgeon.    [facility] - Preparing for Surgery Before surgery, you can play an important role.  Because skin is not sterile, your skin needs to be as free of germs as possible.  You can reduce the number of germs on your skin by washing with CHG (chlorahexidine gluconate) soap before surgery.  CHG is an antiseptic cleaner which kills germs and bonds with the skin to continue killing germs even after washing. Please DO NOT use if you have an allergy to CHG or antibacterial soaps.  If your skin becomes reddened/irritated stop using the CHG and inform your nurse when you arrive at Short Stay. Do not shave (including legs and underarms) for at least  48 hours prior to the first CHG shower.  You may shave your face/neck. Please follow these instructions carefully:  1.  Shower with CHG Soap the night before surgery and the  morning of Surgery.  2.  If you choose to wash your hair, wash your hair first as usual with your  normal  shampoo.  3.  After you shampoo, rinse your hair and body thoroughly to remove the  shampoo.                           4.  Use CHG as you would any other liquid soap.  You can apply chg directly  to the skin and wash                       Gently with a scrungie or clean washcloth.  5.  Apply the CHG Soap to your body ONLY FROM THE  NECK DOWN.   Do not use on face/ open                           Wound or open sores. Avoid contact with eyes, ears mouth and genitals (private parts).                       Wash face,  Genitals (private parts) with your normal soap.             6.  Wash thoroughly, paying special attention to the area where your surgery  will be performed.  7.  Thoroughly rinse your body with warm water from the neck down.  8.  DO NOT shower/wash with your normal soap after using and rinsing off  the CHG Soap.                9.  Pat yourself dry with a clean towel.            10.  Wear clean pajamas.            11.  Place clean sheets on your bed the night of your first shower and do not  sleep with pets. Day of Surgery : Do not apply any lotions/deodorants the morning of surgery.  Please wear clean clothes to the hospital/surgery center.  FAILURE TO FOLLOW THESE INSTRUCTIONS MAY RESULT IN THE CANCELLATION OF YOUR SURGERY PATIENT SIGNATURE_________________________________  NURSE SIGNATURE__________________________________  ________________________________________________________________________

## 2022-07-28 ENCOUNTER — Other Ambulatory Visit: Payer: Self-pay

## 2022-07-28 ENCOUNTER — Encounter (HOSPITAL_COMMUNITY): Payer: Self-pay

## 2022-07-28 ENCOUNTER — Encounter (HOSPITAL_COMMUNITY)
Admission: RE | Admit: 2022-07-28 | Discharge: 2022-07-28 | Disposition: A | Discharge status: Home / Self Care | Payer: Managed Care, Other (non HMO) | Source: Ambulatory Visit | Attending: Surgery | Admitting: Surgery

## 2022-07-28 VITALS — BP 129/89 | HR 81 | Temp 98.4°F | Resp 16 | Ht 67.0 in | Wt 235.0 lb

## 2022-07-28 DIAGNOSIS — Z01818 Encounter for other preprocedural examination: Secondary | ICD-10-CM

## 2022-07-28 DIAGNOSIS — Z01812 Encounter for preprocedural laboratory examination: Secondary | ICD-10-CM | POA: Diagnosis not present

## 2022-07-28 HISTORY — DX: Gastro-esophageal reflux disease without esophagitis: K21.9

## 2022-07-28 HISTORY — DX: Anemia, unspecified: D64.9

## 2022-07-28 LAB — CBC
HCT: 38 % (ref 36.0–46.0)
Hemoglobin: 11.3 g/dL — ABNORMAL LOW (ref 12.0–15.0)
MCH: 23.9 pg — ABNORMAL LOW (ref 26.0–34.0)
MCHC: 29.7 g/dL — ABNORMAL LOW (ref 30.0–36.0)
MCV: 80.3 fL (ref 80.0–100.0)
Platelets: 395 10*3/uL (ref 150–400)
RBC: 4.73 MIL/uL (ref 3.87–5.11)
RDW: 24.9 % — ABNORMAL HIGH (ref 11.5–15.5)
WBC: 8 10*3/uL (ref 4.0–10.5)
nRBC: 0 % (ref 0.0–0.2)

## 2022-08-08 ENCOUNTER — Ambulatory Visit (HOSPITAL_BASED_OUTPATIENT_CLINIC_OR_DEPARTMENT_OTHER): Payer: Managed Care, Other (non HMO) | Admitting: Anesthesiology

## 2022-08-08 ENCOUNTER — Ambulatory Visit (HOSPITAL_COMMUNITY): Payer: Managed Care, Other (non HMO) | Admitting: Anesthesiology

## 2022-08-08 ENCOUNTER — Encounter (HOSPITAL_COMMUNITY)
Admission: AD | Disposition: A | Discharge status: Home / Self Care | Payer: Self-pay | Source: Home / Self Care | Attending: Surgery

## 2022-08-08 ENCOUNTER — Other Ambulatory Visit: Payer: Self-pay

## 2022-08-08 ENCOUNTER — Inpatient Hospital Stay (HOSPITAL_COMMUNITY)
Admission: AD | Admit: 2022-08-08 | Discharge: 2022-08-12 | DRG: 326 | Disposition: A | Discharge status: Home / Self Care | Payer: Managed Care, Other (non HMO) | Attending: Surgery | Admitting: Surgery

## 2022-08-08 ENCOUNTER — Encounter (HOSPITAL_COMMUNITY): Payer: Self-pay | Admitting: Surgery

## 2022-08-08 DIAGNOSIS — J9851 Mediastinitis: Secondary | ICD-10-CM | POA: Diagnosis present

## 2022-08-08 DIAGNOSIS — E669 Obesity, unspecified: Secondary | ICD-10-CM | POA: Diagnosis present

## 2022-08-08 DIAGNOSIS — Z6836 Body mass index (BMI) 36.0-36.9, adult: Secondary | ICD-10-CM

## 2022-08-08 DIAGNOSIS — Z87891 Personal history of nicotine dependence: Secondary | ICD-10-CM

## 2022-08-08 DIAGNOSIS — K2100 Gastro-esophageal reflux disease with esophagitis, without bleeding: Secondary | ICD-10-CM | POA: Diagnosis present

## 2022-08-08 DIAGNOSIS — K253 Acute gastric ulcer without hemorrhage or perforation: Secondary | ICD-10-CM | POA: Diagnosis present

## 2022-08-08 DIAGNOSIS — K449 Diaphragmatic hernia without obstruction or gangrene: Secondary | ICD-10-CM

## 2022-08-08 DIAGNOSIS — K440 Diaphragmatic hernia with obstruction, without gangrene: Principal | ICD-10-CM | POA: Diagnosis present

## 2022-08-08 DIAGNOSIS — Z79899 Other long term (current) drug therapy: Secondary | ICD-10-CM

## 2022-08-08 DIAGNOSIS — I10 Essential (primary) hypertension: Secondary | ICD-10-CM

## 2022-08-08 DIAGNOSIS — Z79890 Hormone replacement therapy (postmenopausal): Secondary | ICD-10-CM

## 2022-08-08 DIAGNOSIS — D500 Iron deficiency anemia secondary to blood loss (chronic): Secondary | ICD-10-CM | POA: Diagnosis present

## 2022-08-08 DIAGNOSIS — Z6830 Body mass index (BMI) 30.0-30.9, adult: Secondary | ICD-10-CM

## 2022-08-08 DIAGNOSIS — M1990 Unspecified osteoarthritis, unspecified site: Secondary | ICD-10-CM | POA: Diagnosis present

## 2022-08-08 DIAGNOSIS — K279 Peptic ulcer, site unspecified, unspecified as acute or chronic, without hemorrhage or perforation: Secondary | ICD-10-CM | POA: Diagnosis present

## 2022-08-08 DIAGNOSIS — Z01818 Encounter for other preprocedural examination: Secondary | ICD-10-CM

## 2022-08-08 DIAGNOSIS — Z90710 Acquired absence of both cervix and uterus: Secondary | ICD-10-CM

## 2022-08-08 DIAGNOSIS — Z8249 Family history of ischemic heart disease and other diseases of the circulatory system: Secondary | ICD-10-CM

## 2022-08-08 DIAGNOSIS — Z8711 Personal history of peptic ulcer disease: Secondary | ICD-10-CM

## 2022-08-08 DIAGNOSIS — M545 Low back pain, unspecified: Secondary | ICD-10-CM | POA: Diagnosis present

## 2022-08-08 DIAGNOSIS — R1310 Dysphagia, unspecified: Secondary | ICD-10-CM | POA: Diagnosis present

## 2022-08-08 DIAGNOSIS — Z886 Allergy status to analgesic agent status: Secondary | ICD-10-CM

## 2022-08-08 HISTORY — PX: XI ROBOTIC ASSISTED PARAESOPHAGEAL HERNIA REPAIR: SHX6871

## 2022-08-08 SURGERY — REPAIR, HERNIA, PARAESOPHAGEAL, ROBOT-ASSISTED
Anesthesia: General

## 2022-08-08 MED ORDER — PHENOL 1.4 % MT LIQD: 2.0000 | OROMUCOSAL | Status: DC | PRN

## 2022-08-08 MED ORDER — ORAL CARE MOUTH RINSE: 15.0000 mL | Freq: Once | OROMUCOSAL | Status: AC

## 2022-08-08 MED ORDER — ENSURE PRE-SURGERY PO LIQD: 592.0000 mL | Freq: Once | ORAL | Status: DC

## 2022-08-08 MED ORDER — LIDOCAINE HCL (CARDIAC) PF 100 MG/5ML IV SOSY
PREFILLED_SYRINGE | INTRAVENOUS | Status: DC | PRN
  Administered 2022-08-08: 60 mg via INTRAVENOUS

## 2022-08-08 MED ORDER — ONDANSETRON HCL 4 MG/2ML IJ SOLN
INTRAMUSCULAR | Status: AC
  Filled 2022-08-08: qty 2

## 2022-08-08 MED ORDER — ALUM & MAG HYDROXIDE-SIMETH 200-200-20 MG/5ML PO SUSP: 30.0000 mL | Freq: Four times a day (QID) | ORAL | Status: DC | PRN

## 2022-08-08 MED ORDER — HYDROMORPHONE HCL 1 MG/ML IJ SOLN
0.2500 mg | INTRAMUSCULAR | Status: DC | PRN
  Administered 2022-08-08: 0.5 mg via INTRAVENOUS

## 2022-08-08 MED ORDER — ROCURONIUM BROMIDE 100 MG/10ML IV SOLN
INTRAVENOUS | Status: DC | PRN
  Administered 2022-08-08: 30 mg via INTRAVENOUS
  Administered 2022-08-08: 100 mg via INTRAVENOUS

## 2022-08-08 MED ORDER — DEXAMETHASONE SODIUM PHOSPHATE 4 MG/ML IJ SOLN
4.0000 mg | Freq: Two times a day (BID) | INTRAMUSCULAR | Status: AC
  Administered 2022-08-08 – 2022-08-10 (×5): 4 mg via INTRAVENOUS
  Filled 2022-08-08 (×6): qty 1

## 2022-08-08 MED ORDER — 0.9 % SODIUM CHLORIDE (POUR BTL) OPTIME
TOPICAL | Status: DC | PRN
  Administered 2022-08-08: 1000 mL

## 2022-08-08 MED ORDER — HYDROMORPHONE HCL 2 MG/ML IJ SOLN
INTRAMUSCULAR | Status: AC
  Filled 2022-08-08: qty 1

## 2022-08-08 MED ORDER — AMISULPRIDE (ANTIEMETIC) 5 MG/2ML IV SOLN
INTRAVENOUS | Status: AC
  Administered 2022-08-08: 10 mg via INTRAVENOUS
  Filled 2022-08-08: qty 4

## 2022-08-08 MED ORDER — MENTHOL 3 MG MT LOZG: 1.0000 | LOZENGE | OROMUCOSAL | Status: DC | PRN

## 2022-08-08 MED ORDER — ONDANSETRON HCL 4 MG PO TABS: 4.0000 mg | ORAL_TABLET | Freq: Three times a day (TID) | ORAL | 5 refills | Status: DC | PRN

## 2022-08-08 MED ORDER — MAGIC MOUTHWASH: 15.0000 mL | Freq: Four times a day (QID) | ORAL | Status: DC | PRN

## 2022-08-08 MED ORDER — LACTATED RINGERS IV SOLN: INTRAVENOUS | Status: AC

## 2022-08-08 MED ORDER — ONDANSETRON 4 MG PO TBDP: 4.0000 mg | ORAL_TABLET | Freq: Four times a day (QID) | ORAL | Status: DC | PRN

## 2022-08-08 MED ORDER — METHOCARBAMOL 1000 MG/10ML IJ SOLN
1000.0000 mg | Freq: Four times a day (QID) | INTRAVENOUS | Status: DC | PRN
  Administered 2022-08-10: 1000 mg via INTRAVENOUS
  Filled 2022-08-08: qty 1000

## 2022-08-08 MED ORDER — PHENYLEPHRINE HCL (PRESSORS) 10 MG/ML IV SOLN
INTRAVENOUS | Status: DC | PRN
  Administered 2022-08-08: 80 ug via INTRAVENOUS
  Administered 2022-08-08: 160 ug via INTRAVENOUS

## 2022-08-08 MED ORDER — ROCURONIUM BROMIDE 10 MG/ML (PF) SYRINGE
PREFILLED_SYRINGE | INTRAVENOUS | Status: AC
  Filled 2022-08-08: qty 10

## 2022-08-08 MED ORDER — METOPROLOL TARTRATE 5 MG/5ML IV SOLN: 5.0000 mg | Freq: Four times a day (QID) | INTRAVENOUS | Status: DC | PRN

## 2022-08-08 MED ORDER — DIPHENHYDRAMINE HCL 50 MG/ML IJ SOLN: 12.5000 mg | Freq: Four times a day (QID) | INTRAMUSCULAR | Status: DC | PRN

## 2022-08-08 MED ORDER — SODIUM CHLORIDE 0.9% FLUSH: 3.0000 mL | INTRAVENOUS | Status: DC | PRN

## 2022-08-08 MED ORDER — HYDROMORPHONE HCL 1 MG/ML IJ SOLN
INTRAMUSCULAR | Status: DC | PRN
  Administered 2022-08-08 (×2): 1 mg via INTRAVENOUS

## 2022-08-08 MED ORDER — HYDROMORPHONE HCL 1 MG/ML IJ SOLN
0.5000 mg | INTRAMUSCULAR | Status: DC | PRN
  Administered 2022-08-08 – 2022-08-10 (×6): 1 mg via INTRAVENOUS
  Administered 2022-08-10: 2 mg via INTRAVENOUS
  Administered 2022-08-10 (×2): 1 mg via INTRAVENOUS
  Administered 2022-08-11: 2 mg via INTRAVENOUS
  Administered 2022-08-11: 1 mg via INTRAVENOUS
  Filled 2022-08-08: qty 2
  Filled 2022-08-08 (×7): qty 1
  Filled 2022-08-08: qty 2
  Filled 2022-08-08: qty 1
  Filled 2022-08-08: qty 2

## 2022-08-08 MED ORDER — METOCLOPRAMIDE HCL 5 MG/ML IJ SOLN: 5.0000 mg | Freq: Three times a day (TID) | INTRAMUSCULAR | Status: DC | PRN

## 2022-08-08 MED ORDER — ENOXAPARIN SODIUM 40 MG/0.4ML IJ SOSY
40.0000 mg | PREFILLED_SYRINGE | INTRAMUSCULAR | Status: DC
  Administered 2022-08-09 – 2022-08-12 (×4): 40 mg via SUBCUTANEOUS
  Filled 2022-08-08 (×4): qty 0.4

## 2022-08-08 MED ORDER — BISACODYL 10 MG RE SUPP: 10.0000 mg | Freq: Two times a day (BID) | RECTAL | Status: DC | PRN

## 2022-08-08 MED ORDER — PROPOFOL 10 MG/ML IV BOLUS
INTRAVENOUS | Status: AC
  Filled 2022-08-08: qty 20

## 2022-08-08 MED ORDER — SODIUM CHLORIDE 0.9 % IV SOLN: 250.0000 mL | INTRAVENOUS | Status: DC | PRN

## 2022-08-08 MED ORDER — SCOPOLAMINE 1 MG/3DAYS TD PT72
1.0000 | MEDICATED_PATCH | TRANSDERMAL | Status: DC
  Administered 2022-08-08: 1.5 mg via TRANSDERMAL
  Filled 2022-08-08: qty 1

## 2022-08-08 MED ORDER — LACTATED RINGERS IV SOLN: INTRAVENOUS | Status: DC

## 2022-08-08 MED ORDER — ACETAMINOPHEN 500 MG PO TABS
1000.0000 mg | ORAL_TABLET | Freq: Four times a day (QID) | ORAL | Status: DC
  Administered 2022-08-08 – 2022-08-12 (×15): 1000 mg via ORAL
  Filled 2022-08-08 (×16): qty 2

## 2022-08-08 MED ORDER — METHOCARBAMOL 500 MG PO TABS: 1000.0000 mg | ORAL_TABLET | Freq: Four times a day (QID) | ORAL | Status: DC | PRN

## 2022-08-08 MED ORDER — HYDROMORPHONE HCL 1 MG/ML IJ SOLN
INTRAMUSCULAR | Status: AC
  Administered 2022-08-08: 0.5 mg via INTRAVENOUS
  Filled 2022-08-08: qty 1

## 2022-08-08 MED ORDER — BUPIVACAINE-EPINEPHRINE 0.25% -1:200000 IJ SOLN
INTRAMUSCULAR | Status: AC
  Filled 2022-08-08: qty 1

## 2022-08-08 MED ORDER — LACTATED RINGERS IV BOLUS: 1000.0000 mL | Freq: Three times a day (TID) | INTRAVENOUS | Status: AC | PRN

## 2022-08-08 MED ORDER — PROCHLORPERAZINE EDISYLATE 10 MG/2ML IJ SOLN: 5.0000 mg | Freq: Four times a day (QID) | INTRAMUSCULAR | Status: DC | PRN

## 2022-08-08 MED ORDER — OXYCODONE HCL 5 MG PO TABS
5.0000 mg | ORAL_TABLET | ORAL | Status: DC | PRN
  Administered 2022-08-08 – 2022-08-09 (×4): 10 mg via ORAL
  Administered 2022-08-09: 5 mg via ORAL
  Administered 2022-08-09 – 2022-08-11 (×6): 10 mg via ORAL
  Filled 2022-08-08: qty 2
  Filled 2022-08-08: qty 1
  Filled 2022-08-08 (×11): qty 2

## 2022-08-08 MED ORDER — SUGAMMADEX SODIUM 500 MG/5ML IV SOLN
INTRAVENOUS | Status: DC | PRN
  Administered 2022-08-08: 400 mg via INTRAVENOUS

## 2022-08-08 MED ORDER — SODIUM CHLORIDE 0.9 % IV SOLN: Freq: Three times a day (TID) | INTRAVENOUS | Status: AC | PRN

## 2022-08-08 MED ORDER — CHLORHEXIDINE GLUCONATE CLOTH 2 % EX PADS: 6.0000 | MEDICATED_PAD | Freq: Once | CUTANEOUS | Status: DC

## 2022-08-08 MED ORDER — GABAPENTIN 300 MG PO CAPS
300.0000 mg | ORAL_CAPSULE | ORAL | Status: AC
  Administered 2022-08-08: 300 mg via ORAL
  Filled 2022-08-08: qty 1

## 2022-08-08 MED ORDER — DEXAMETHASONE SODIUM PHOSPHATE 4 MG/ML IJ SOLN: 4.0000 mg | INTRAMUSCULAR | Status: DC

## 2022-08-08 MED ORDER — FENTANYL CITRATE (PF) 250 MCG/5ML IJ SOLN
INTRAMUSCULAR | Status: AC
  Filled 2022-08-08: qty 5

## 2022-08-08 MED ORDER — OXYCODONE HCL 5 MG/5ML PO SOLN: 5.0000 mg | Freq: Once | ORAL | Status: AC | PRN

## 2022-08-08 MED ORDER — BISACODYL 10 MG RE SUPP: 10.0000 mg | Freq: Every day | RECTAL | Status: DC | PRN

## 2022-08-08 MED ORDER — ACETAMINOPHEN 500 MG PO TABS
1000.0000 mg | ORAL_TABLET | ORAL | Status: AC
  Administered 2022-08-08: 1000 mg via ORAL
  Filled 2022-08-08: qty 2

## 2022-08-08 MED ORDER — SODIUM CHLORIDE 0.9% FLUSH
3.0000 mL | Freq: Two times a day (BID) | INTRAVENOUS | Status: DC
  Administered 2022-08-08 – 2022-08-12 (×8): 3 mL via INTRAVENOUS

## 2022-08-08 MED ORDER — TRAMADOL HCL 50 MG PO TABS
50.0000 mg | ORAL_TABLET | Freq: Four times a day (QID) | ORAL | Status: DC | PRN
  Administered 2022-08-08 – 2022-08-10 (×4): 100 mg via ORAL
  Filled 2022-08-08 (×4): qty 2

## 2022-08-08 MED ORDER — ONDANSETRON HCL 4 MG/2ML IJ SOLN
4.0000 mg | Freq: Four times a day (QID) | INTRAMUSCULAR | Status: DC | PRN
  Administered 2022-08-09: 4 mg via INTRAVENOUS
  Filled 2022-08-08: qty 2

## 2022-08-08 MED ORDER — ONDANSETRON HCL 4 MG/2ML IJ SOLN: 4.0000 mg | Freq: Once | INTRAMUSCULAR | Status: DC | PRN

## 2022-08-08 MED ORDER — MIDAZOLAM HCL 2 MG/2ML IJ SOLN
INTRAMUSCULAR | Status: AC
  Filled 2022-08-08: qty 2

## 2022-08-08 MED ORDER — PROPOFOL 10 MG/ML IV BOLUS
INTRAVENOUS | Status: DC | PRN
  Administered 2022-08-08: 200 mg via INTRAVENOUS

## 2022-08-08 MED ORDER — MEPERIDINE HCL 50 MG/ML IJ SOLN
INTRAMUSCULAR | Status: AC
  Filled 2022-08-08: qty 1

## 2022-08-08 MED ORDER — METOCLOPRAMIDE HCL 5 MG PO TABS: 5.0000 mg | ORAL_TABLET | Freq: Three times a day (TID) | ORAL | Status: DC | PRN

## 2022-08-08 MED ORDER — KETAMINE HCL 50 MG/5ML IJ SOSY
PREFILLED_SYRINGE | INTRAMUSCULAR | Status: AC
  Filled 2022-08-08: qty 5

## 2022-08-08 MED ORDER — AMISULPRIDE (ANTIEMETIC) 5 MG/2ML IV SOLN: 10.0000 mg | Freq: Once | INTRAVENOUS | Status: AC | PRN

## 2022-08-08 MED ORDER — DEXAMETHASONE SODIUM PHOSPHATE 10 MG/ML IJ SOLN
INTRAMUSCULAR | Status: AC
  Filled 2022-08-08: qty 1

## 2022-08-08 MED ORDER — SIMETHICONE 80 MG PO CHEW
40.0000 mg | CHEWABLE_TABLET | Freq: Four times a day (QID) | ORAL | Status: DC | PRN
  Administered 2022-08-08 – 2022-08-11 (×3): 40 mg via ORAL
  Filled 2022-08-08 (×3): qty 1

## 2022-08-08 MED ORDER — GABAPENTIN 100 MG PO CAPS
300.0000 mg | ORAL_CAPSULE | Freq: Two times a day (BID) | ORAL | Status: DC
  Administered 2022-08-08 – 2022-08-10 (×5): 300 mg via ORAL
  Filled 2022-08-08 (×5): qty 3

## 2022-08-08 MED ORDER — BUPIVACAINE-EPINEPHRINE (PF) 0.25% -1:200000 IJ SOLN
INTRAMUSCULAR | Status: DC | PRN
  Administered 2022-08-08: 70 mL

## 2022-08-08 MED ORDER — OXYCODONE HCL 5 MG PO TABS
ORAL_TABLET | ORAL | Status: AC
  Filled 2022-08-08: qty 1

## 2022-08-08 MED ORDER — MEPERIDINE HCL 50 MG/ML IJ SOLN
6.2500 mg | INTRAMUSCULAR | Status: DC | PRN
  Administered 2022-08-08: 12.5 mg via INTRAVENOUS

## 2022-08-08 MED ORDER — OXYCODONE HCL 5 MG PO TABS: 5.0000 mg | ORAL_TABLET | Freq: Four times a day (QID) | ORAL | 0 refills | Status: DC | PRN

## 2022-08-08 MED ORDER — CHLORHEXIDINE GLUCONATE 0.12 % MT SOLN
15.0000 mL | Freq: Once | OROMUCOSAL | Status: AC
  Administered 2022-08-08: 15 mL via OROMUCOSAL

## 2022-08-08 MED ORDER — BUPIVACAINE LIPOSOME 1.3 % IJ SUSP
INTRAMUSCULAR | Status: AC
  Filled 2022-08-08: qty 20

## 2022-08-08 MED ORDER — KETAMINE HCL 10 MG/ML IJ SOLN
INTRAMUSCULAR | Status: DC | PRN
  Administered 2022-08-08: 20 mg via INTRAVENOUS
  Administered 2022-08-08: 30 mg via INTRAVENOUS

## 2022-08-08 MED ORDER — DEXAMETHASONE SODIUM PHOSPHATE 10 MG/ML IJ SOLN
INTRAMUSCULAR | Status: DC | PRN
  Administered 2022-08-08: 10 mg via INTRAVENOUS

## 2022-08-08 MED ORDER — ONDANSETRON HCL 4 MG/2ML IJ SOLN
INTRAMUSCULAR | Status: DC | PRN
  Administered 2022-08-08: 4 mg via INTRAVENOUS

## 2022-08-08 MED ORDER — FENTANYL CITRATE (PF) 100 MCG/2ML IJ SOLN
INTRAMUSCULAR | Status: DC | PRN
  Administered 2022-08-08: 50 ug via INTRAVENOUS
  Administered 2022-08-08 (×2): 100 ug via INTRAVENOUS

## 2022-08-08 MED ORDER — DIPHENHYDRAMINE HCL 12.5 MG/5ML PO ELIX: 12.5000 mg | ORAL_SOLUTION | Freq: Four times a day (QID) | ORAL | Status: DC | PRN

## 2022-08-08 MED ORDER — LIDOCAINE HCL (PF) 2 % IJ SOLN
INTRAMUSCULAR | Status: AC
  Filled 2022-08-08: qty 5

## 2022-08-08 MED ORDER — ACETAMINOPHEN 500 MG PO TABS
1000.0000 mg | ORAL_TABLET | Freq: Once | ORAL | Status: DC
Start: 2022-08-08 — End: 2022-08-08

## 2022-08-08 MED ORDER — ENSURE PRE-SURGERY PO LIQD: 296.0000 mL | Freq: Once | ORAL | Status: DC

## 2022-08-08 MED ORDER — MIDAZOLAM HCL 5 MG/5ML IJ SOLN
INTRAMUSCULAR | Status: DC | PRN
  Administered 2022-08-08: 2 mg via INTRAVENOUS

## 2022-08-08 MED ORDER — PROCHLORPERAZINE MALEATE 10 MG PO TABS: 10.0000 mg | ORAL_TABLET | Freq: Four times a day (QID) | ORAL | Status: DC | PRN

## 2022-08-08 MED ORDER — OXYCODONE HCL 5 MG PO TABS
5.0000 mg | ORAL_TABLET | Freq: Once | ORAL | Status: AC | PRN
  Administered 2022-08-08: 5 mg via ORAL

## 2022-08-08 MED ORDER — BUPIVACAINE LIPOSOME 1.3 % IJ SUSP: 20.0000 mL | Freq: Once | INTRAMUSCULAR | Status: DC

## 2022-08-08 MED ORDER — LACTATED RINGERS IV SOLN: INTRAVENOUS | Status: DC | PRN

## 2022-08-08 MED ORDER — METHOCARBAMOL 500 MG PO TABS
500.0000 mg | ORAL_TABLET | Freq: Four times a day (QID) | ORAL | Status: DC | PRN
  Administered 2022-08-08 – 2022-08-10 (×6): 500 mg via ORAL
  Filled 2022-08-08 (×8): qty 1

## 2022-08-08 MED ORDER — MAGNESIUM HYDROXIDE 400 MG/5ML PO SUSP: 30.0000 mL | Freq: Two times a day (BID) | ORAL | Status: DC | PRN

## 2022-08-08 MED ORDER — SODIUM CHLORIDE 0.9 % IV SOLN
2.0000 g | INTRAVENOUS | Status: AC
  Administered 2022-08-08: 2 g via INTRAVENOUS
  Filled 2022-08-08: qty 20

## 2022-08-08 MED ORDER — PHENYLEPHRINE 80 MCG/ML (10ML) SYRINGE FOR IV PUSH (FOR BLOOD PRESSURE SUPPORT)
PREFILLED_SYRINGE | INTRAVENOUS | Status: AC
  Filled 2022-08-08: qty 10

## 2022-08-08 MED ORDER — PANTOPRAZOLE SODIUM 40 MG PO TBEC
40.0000 mg | DELAYED_RELEASE_TABLET | Freq: Two times a day (BID) | ORAL | Status: DC
  Administered 2022-08-08 – 2022-08-10 (×6): 40 mg via ORAL
  Filled 2022-08-08 (×6): qty 1

## 2022-08-08 SURGICAL SUPPLY — 75 items
APL PRP STRL LF DISP 70% ISPRP (MISCELLANEOUS) ×1
APL SWBSTK 6 STRL LF DISP (MISCELLANEOUS) ×1
APPLICATOR COTTON TIP 6 STRL (MISCELLANEOUS) ×1 IMPLANT
APPLICATOR COTTON TIP 6IN STRL (MISCELLANEOUS) ×1 IMPLANT
APPLIER CLIP 5 13 M/L LIGAMAX5 (MISCELLANEOUS)
APR CLP MED LRG 5 ANG JAW (MISCELLANEOUS)
BAG COUNTER SPONGE SURGICOUNT (BAG) ×1 IMPLANT
BAG SPNG CNTER NS LX DISP (BAG) ×1
BLADE SURG SZ11 CARB STEEL (BLADE) ×1 IMPLANT
CHLORAPREP W/TINT 26 (MISCELLANEOUS) ×1 IMPLANT
CLIP APPLIE 5 13 M/L LIGAMAX5 (MISCELLANEOUS) IMPLANT
COVER SURGICAL LIGHT HANDLE (MISCELLANEOUS) ×1 IMPLANT
COVER TIP SHEARS 8 DVNC (MISCELLANEOUS) IMPLANT
DRAIN CHANNEL 19F RND (DRAIN) IMPLANT
DRAIN PENROSE 0.5X18 (DRAIN) IMPLANT
DRAIN RELI 100 BL SUC LF ST (DRAIN)
DRAPE ARM DVNC X/XI (DISPOSABLE) ×4 IMPLANT
DRAPE COLUMN DVNC XI (DISPOSABLE) ×1 IMPLANT
DRAPE WARM FLUID 44X44 (DRAPES) ×1 IMPLANT
DRIVER NDL LRG 8 DVNC XI (INSTRUMENTS) ×2 IMPLANT
DRIVER NDL MEGA SUTCUT DVNCXI (INSTRUMENTS) ×1 IMPLANT
DRIVER NDLE LRG 8 DVNC XI (INSTRUMENTS) IMPLANT
DRIVER NDLE MEGA SUTCUT DVNCXI (INSTRUMENTS) ×1 IMPLANT
DRSG TEGADERM 2-3/8X2-3/4 SM (GAUZE/BANDAGES/DRESSINGS) ×5 IMPLANT
ELECT REM PT RETURN 15FT ADLT (MISCELLANEOUS) ×1 IMPLANT
ENDOLOOP SUT PDS II 0 18 (SUTURE) IMPLANT
EVACUATOR DRAINAGE 10X20 100CC (DRAIN) IMPLANT
EVACUATOR SILICONE 100CC (DRAIN) ×1 IMPLANT
FELT TEFLON 4 X1 (Mesh General) ×1 IMPLANT
FORCEPS BPLR FENES DVNC XI (FORCEP) IMPLANT
FORCEPS PROGRASP DVNC XI (FORCEP) ×1 IMPLANT
GAUZE SPONGE 2X2 8PLY STRL LF (GAUZE/BANDAGES/DRESSINGS) ×1 IMPLANT
GLOVE ECLIPSE 8.0 STRL XLNG CF (GLOVE) ×2 IMPLANT
GLOVE INDICATOR 8.0 STRL GRN (GLOVE) ×2 IMPLANT
GOWN STRL REUS W/ TWL XL LVL3 (GOWN DISPOSABLE) ×4 IMPLANT
GOWN STRL REUS W/TWL XL LVL3 (GOWN DISPOSABLE) ×4
GRASPER SUT TROCAR 14GX15 (MISCELLANEOUS) IMPLANT
GRASPER TIP-UP FEN DVNC XI (INSTRUMENTS) ×1 IMPLANT
IRRIG SUCT STRYKERFLOW 2 WTIP (MISCELLANEOUS) ×1
IRRIGATION SUCT STRKRFLW 2 WTP (MISCELLANEOUS) ×1 IMPLANT
KIT BASIN OR (CUSTOM PROCEDURE TRAY) ×1 IMPLANT
KIT TURNOVER KIT A (KITS) IMPLANT
MESH BIO-A 7X10 SYN MAT (Mesh General) IMPLANT
NDL HYPO 22X1.5 SAFETY MO (MISCELLANEOUS) ×1 IMPLANT
NDL INSUFFLATION 14GA 120MM (NEEDLE) IMPLANT
NEEDLE HYPO 22X1.5 SAFETY MO (MISCELLANEOUS) ×1 IMPLANT
NEEDLE INSUFFLATION 14GA 120MM (NEEDLE) ×1 IMPLANT
PACK CARDIOVASCULAR III (CUSTOM PROCEDURE TRAY) ×1 IMPLANT
PAD POSITIONING PINK XL (MISCELLANEOUS) ×1 IMPLANT
PENCIL SMOKE EVACUATOR (MISCELLANEOUS) IMPLANT
SCISSORS LAP 5X45 EPIX DISP (ENDOMECHANICALS) IMPLANT
SCISSORS MNPLR CVD DVNC XI (INSTRUMENTS) ×1 IMPLANT
SEAL UNIV 5-12 XI (MISCELLANEOUS) ×3 IMPLANT
SEALER VESSEL EXT DVNC XI (MISCELLANEOUS) ×1 IMPLANT
SOL ELECTROSURG ANTI STICK (MISCELLANEOUS) ×1
SOLUTION ELECTROSURG ANTI STCK (MISCELLANEOUS) ×1 IMPLANT
SPIKE FLUID TRANSFER (MISCELLANEOUS) ×1 IMPLANT
STOPCOCK 4 WAY LG BORE MALE ST (IV SETS) ×2 IMPLANT
SUT ETHIBOND 0 36 GRN (SUTURE) ×2 IMPLANT
SUT ETHIBOND NAB CT1 #1 30IN (SUTURE) ×3 IMPLANT
SUT MNCRL AB 4-0 PS2 18 (SUTURE) ×1 IMPLANT
SUT PROLENE 2 0 SH DA (SUTURE) IMPLANT
SUT V-LOC BARB 180 2/0GR6 GS22 (SUTURE) ×1
SUT VICRYL 0 TIES 12 18 (SUTURE) IMPLANT
SUT VICRYL 0 UR6 27IN ABS (SUTURE) IMPLANT
SUT VLOC 180 2-0 9IN GS21 (SUTURE) IMPLANT
SUTURE V-LC BRB 180 2/0GR6GS22 (SUTURE) IMPLANT
SYR 10ML LL (SYRINGE) ×1 IMPLANT
SYR 20ML LL LF (SYRINGE) ×1 IMPLANT
TOWEL OR 17X26 10 PK STRL BLUE (TOWEL DISPOSABLE) ×1 IMPLANT
TOWEL OR NON WOVEN STRL DISP B (DISPOSABLE) ×1 IMPLANT
TRAY FOLEY MTR SLVR 14FR STAT (SET/KITS/TRAYS/PACK) IMPLANT
TRAY FOLEY MTR SLVR 16FR STAT (SET/KITS/TRAYS/PACK) IMPLANT
TROCAR ADV FIXATION 5X100MM (TROCAR) ×1 IMPLANT
TUBING INSUFFLATION 10FT LAP (TUBING) ×1 IMPLANT

## 2022-08-08 NOTE — H&P (Signed)
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 female  who is seen today as an office consultation  at the request of DrEllin Mayhew  for evaluation of high hernia with Sheria Lang ulcerations.  History of Present Illness:  47 year old obese woman. [place] and had worsening lightheadedness. Went to emergency department at [facility] and found to have a hemoglobin of 3.7. Was admitted and transfused with 4 units of blood. Underwent endoscopy and found to have incarcerated hiatal hernia with ulcerations. Felt to be the etiology. Was placed on proton pump inhibitors and Carafate. Recommendation made for follow-up closer to home. I had operated on her husband last year, so she wished to follow-up with me. In review discussion with patient, she also had an episode of nausea vomiting and epigastric pain in 2019 and went to emergency department and was palliated with antiacid medication. She had a CAT scan in 2014 that showed a very small hiatal hernia but now seems much more giant based on the endoscopy done last week. CAT scan at [facility] notes a larger hiatal hernia but I cannot see the films.  Patient is feeling better after 4 units of transfusion but is terrified of getting that weak and deconditioned again. Wishes to be aggressive and have something done. She does get some dysphagia to some solid foods and spitting up. Occasional gagging. No major episodes of emesis. She  cannot sleep on her stomach but can usually sleep in a bed somewhat propped up. Denies any major reflux bending over or wheezing or asthma per se. And established through [facility]. He does not have a gastroenterologist in the region. She used to smoke but quit 30 years ago. Never had a colonoscopy and is due. She usually moves his bowels every day. She denies any hematochezia or melena but has had some darker stools since she started on iron orally from the hospital. She remains on that.  Medical History:  Past Medical History:  Diagnosis Date  Anemia  Arthritis  GERD (gastroesophageal reflux disease)   Patient Active Problem List  Diagnosis  Acute Sheria Lang ulcer  Iron deficiency anemia due to chronic blood loss  BMI 30.0-30.9,adult  Incarcerated hiatal hernia   Past Surgical History:  Procedure Laterality Date  HYSTERECTOMY 01/2012    Allergies  Allergen Reactions  Nsaids (Non-Steroidal Anti-Inflammatory Drug) Other (See Comments)  H/o ulcers   Current Outpatient Medications on File Prior to Visit  Medication Sig Dispense Refill  norgestimate-ethinyl estradiol triphasic (ORTHO TRI-CYCLEN LO) 0.18/0.215/0.25 mg-25 mcg tablet Take 1 tablet by mouth once daily  omeprazole (PRILOSEC OTC) 20 MG EC tablet Take 20 mg by mouth once daily  sucralfate (CARAFATE) 1 gram tablet Take by mouth   No current facility-administered medications on file prior to visit.   History reviewed. No pertinent family history.   Social History   Tobacco Use  Smoking Status Former  Types: Cigarettes  Start date: 1998  Smokeless Tobacco Never    Social History   Socioeconomic History  Marital status: Married  Tobacco Use  Smoking status: Former  Types: Cigarettes  Start date: 1998  Smokeless tobacco: Never  Substance and Sexual Activity  Alcohol use: Not Currently  Drug use: Never   Social Determinants of Health   Food Insecurity: Low Risk (05/28/2022)  Received from [facility]  Food vital sign  Within the past 12 months, you worried that your food would run out before you got money to buy more: Never true  Within the past 12 months, the food you bought just didn't last and you didn't have money to get more. : Never true  Housing Stability: Low Risk (05/28/2022)  Received from [facility]  Living Situation  What is your living situation today?: I have a steady place to live  Think about the place you live. Do you have problems with any of the following? Choose all that apply:: None/None on this list   ############################################################  Review of Systems: A complete review of systems (ROS) was obtained from the patient.  We have reviewed this information and discussed as appropriate with the patient.  See HPI as well for other pertinent ROS.  Constitutional: No fevers, chills, sweats. Weight stable Eyes: No vision changes, No discharge HENT: No sore throats, nasal drainage Lymph: No neck swelling, No bruising easily Pulmonary: No cough, productive sputum CV: No orthopnea, PND . No exertional chest/neck/shoulder/arm pain. Patient can walk 30 minutes without difficulty.   GI: No personal nor family history of GI/colon cancer, inflammatory bowel disease, irritable bowel syndrome, allergy such as Celiac Sprue, dietary/dairy problems, colitis. No recent sick contacts/gastroenteritis. No travel outside the country. No changes in diet.  Renal: No UTIs, No hematuria Genital: No drainage, bleeding, masses Musculoskeletal: No severe joint pain. Good ROM major joints Skin: No sores or lesions Heme/Lymph: No easy bleeding. No swollen lymph nodes Neuro: No active seizures. No facial droop Psych: No hallucinations. No agitation  OBJECTIVE   Vitals:  06/17/22 0940  BP: 119/82  Pulse: 73  Temp: 36.4 C (97.6 F)  SpO2: 95%  Weight: (!) 106.1 kg (234 lb)  Height: 170.2 cm (5\' 7" )  PainSc: 4  PainLoc: Abdomen   Body mass index  is 36.65 kg/m.  PHYSICAL EXAM:  Constitutional: Not cachectic. Hygeine adequate. Vitals signs as above.  Eyes: Wears glasses - vision corrected,Pupils reactive, normal extraocular movements. Sclera nonicteric Neuro: CN II-XII intact. No major focal sensory defects. No major motor deficits. Lymph: No head/neck/groin lymphadenopathy Psych: No severe agitation. No severe anxiety. Judgment & insight Adequate, Oriented x4, HENT: Normocephalic, Mucus membranes moist. No thrush. Hearing: adequate Neck: Supple, No tracheal deviation. No obvious thyromegaly Chest: No pain to chest wall compression. Good respiratory excursion. No audible wheezing CV: Pulses intact. regular. No major extremity edema Ext: No obvious deformity or contracture. Edema: Not present. No cyanosis Skin: No major subcutaneous nodules. Warm and dry Musculoskeletal: Severe joint rigidity not present. No obvious clubbing. No digital petechiae. Mobility: no assist device moving easily without restrictions  Abdomen: Obese Soft. Nondistended. Tenderness at epigastric region only. Mild. Marland Kitchen Hernia: Not present. Diastasis recti: Not present. No hepatomegaly. No splenomegaly.  Genital/Pelvic: Inguinal hernia: Not present. Inguinal lymph nodes: without lymphadenopathy nor hidradenitis.   Rectal: (Deferred)    ###################################################################  Labs, Imaging and Diagnostic Testing:  Located in 'Care Everywhere' section of Epic EMR chart  PRIOR [facility] CLINIC NOTES:  Not applicable  SURGERY NOTES:  Not applicable  PATHOLOGY:  Not applicable  Assessment and Plan:  DIAGNOSES:  Diagnoses and all orders for this  visit:  Incarcerated hiatal hernia  Acute Sheria Lang ulcer  Iron deficiency anemia due to chronic blood loss  BMI 30.0-30.9,adult    ASSESSMENT/PLAN  Pleasant obese woman with episodes of nausea vomiting in 2019 suspicious for heartburn and episode of severe iron deficiency  anemia hemoglobin 3.7 requiring emergent admission transfusion and workup showing large hiatal hernia with major Cameron ulcerations.  Patient seems to be stabilizing on antiacid regiment of carafate and pantoprazole. Recommend she continue those. Usually gastroenterology weans off the Carafate in a month and then keeps people on PPIs.  Because she has had 2 events requiring going to the hospital, the most recent 1 requiring significant transfusion; I am biased towards considering hiatal hernia repair to prevent further episodes of nausea vomiting pain and severe anemia. This episode terrified her and she does not want to go through it again. She wishes to be aggressive and consider surgery. I will consider a robotic repair with mesh reinforcement. Consider weight loss surgery would have a better long-term outcome, I do not think she can wait another year and she does not want to wait on either. Would plan Bio-A mesh reinforcement if the defect is not too large versus Phasix. We will see.  The anatomy & physiology of the foregut and anti-reflux mechanism was discussed. The pathophysiology of hiatal herniation and GERD was discussed. Natural history risks without surgery was discussed. The patient's symptoms are not adequately controlled by medicines and other non-operative treatments. I feel the risks of no intervention will lead to serious problems that outweigh the operative risks; therefore, I recommended surgery to reduce the hiatal hernia out of the chest and fundoplication to rebuild the anti-reflux valve and control reflux better. Need for a thorough workup to rule out the differential diagnosis and plan treatment was explained. I explained minimally invasive techniques with possible need for an open approach.  Risks such as bleeding, infection, abscess, leak,injury to other organs, need for repair of tissues / organs, need for further treatment, stroke, heart attack, death, and other risks were  discussed. I noted a good likelihood this will help address the problem. Goals of post-operative recovery were discussed as well. Possibility that this will not correct all symptoms was explained. Post-operative dysphagia, need for short-term liquid & pureed diet, inability to vomit, possibility of reherniation, possible need for medicines to help control symptoms in addition to surgery were discussed. We will work to minimize complications. Educational handouts further explaining the pathology, treatment options, and dysphagia diet was given as well. Questions were answered. The patient expresses understanding & wishes to proceed with surgery.  Manometry WNL reassuring  She is due to follow-up with primary care tomorrow. Hopefully the repeating labs to make sure her severe anemia is improved. She went from Hgb 3.7 to 7.9 at discharge and is already feeling better.  Hgb 11.3 on 6/11 reassuring  Agreed that she would benefit from colonoscopy at some point since she is over 45. Most likely through Dr. Cloretta Ned if she stays within the [facility] system vs [facility] DR Gupta/etc in [facility] Do not know if they feel strongly about repeating endoscopy before surgery or not.   Ardeth Sportsman, MD, FACS, MASCRS Esophageal, Gastrointestinal & Colorectal Surgery Robotic and Minimally Invasive Surgery  [facility] [address], [address] [place], [place] [address] [phone] Fax [phone] Main  CONTACT INFORMATION:  Weekday (9AM-5PM): Call [facility] main office at [phone]  Weeknight (5PM-9AM) or Weekend/Holiday: Check  [email or web address] (password " [email or web address]") for General Surgery [facility] coverage  (Please, do not use SecureChat as it is not reliable communication to reach operating surgeons for immediate patient care given surgeries/outpatient duties/clinic/cross-coverage/off post-call which would lead to a delay in care.  Epic staff messaging available for  outptient concerns, but may not be answered for 48 hours or more).    08/08/2022

## 2022-08-08 NOTE — Plan of Care (Signed)
Nutrition Education Note  RD consulted for nutrition education regarding pureed diet for discharge s/p gastric fundoplication.  Reviewed patient's dietary recall. Reports she typically eats 2 meals a day, sometimes up to 3 if she has a breakfast. Has dealt with early satiety and gagging so eager to be able to eat more comfortably.   RD provided Pureed Diet Nutrition Therapy handout from the Academy of [employer]. Reviewed ways to achieve the correct texture and list of foods recommended and those not recommended. Patient does not feel she will do well with pureed meats so plans to try different nutrition supplements after discharge.  RD discussed why it is important for patient to adhere to diet recommendations.Teach back method used.  Expect good compliance.  Current diet order is Clear Liquids, no meals documented. Labs and medications reviewed. No further nutrition interventions warranted at this time. RD contact information provided. If additional nutrition issues arise, please re-consult RD.   Shelle Iron RD, LDN For contact information, refer to [facility].

## 2022-08-08 NOTE — Anesthesia Preprocedure Evaluation (Addendum)
Anesthesia Evaluation  Patient identified by MRN, date of birth, ID band Patient awake    Reviewed: Allergy & Precautions, H&P , NPO status , Patient's Chart, lab work & pertinent test results  Airway Mallampati: III  TM Distance: >3 FB Neck ROM: Full    Dental no notable dental hx.    Pulmonary neg pulmonary ROS   Pulmonary exam normal breath sounds clear to auscultation       Cardiovascular hypertension (149/87 preop, no home meds- normally 119 SBP, some white coat HTN), Normal cardiovascular exam Rhythm:Regular Rate:Normal     Neuro/Psych negative neurological ROS  negative psych ROS   GI/Hepatic Neg liver ROS,GERD  Poorly Controlled,,Paraesophageal hernia    Endo/Other  negative endocrine ROS    Renal/GU negative Renal ROS  negative genitourinary   Musculoskeletal negative musculoskeletal ROS (+)    Abdominal   Peds negative pediatric ROS (+)  Hematology negative hematology ROS (+) Hb 11.3   Anesthesia Other Findings   Reproductive/Obstetrics negative OB ROS                             Anesthesia Physical Anesthesia Plan  ASA: 2  Anesthesia Plan: General   Post-op Pain Management: Tylenol PO (pre-op)*, Ketamine IV* and Dilaudid IV   Induction: Intravenous  PONV Risk Score and Plan: 4 or greater and Ondansetron, Dexamethasone, Midazolam and Treatment may vary due to age or medical condition  Airway Management Planned: Oral ETT  Additional Equipment: None  Intra-op Plan:   Post-operative Plan: Extubation in OR  Informed Consent: I have reviewed the patients History and Physical, chart, labs and discussed the procedure including the risks, benefits and alternatives for the proposed anesthesia with the patient or authorized representative who has indicated his/her understanding and acceptance.     Dental advisory given  Plan Discussed with: CRNA  Anesthesia Plan  Comments:         Anesthesia Quick Evaluation

## 2022-08-08 NOTE — Op Note (Signed)
08/08/2022  10:43 AM  PATIENT:  [name] [name]  47 y.o. female  Patient Care Team: Caffie Damme, MD as PCP - General (Family Medicine) Karie Soda, MD as Consulting Physician (General Surgery) Delbert Harness, MD (Inactive) as Consulting Physician (Obstetrics and Gynecology)  PRE-OPERATIVE DIAGNOSIS:  PARAESOPHAGEAL HIATAL HERNIA REFRACTORY TO MECICAL MANAGEMENT  POST-OPERATIVE DIAGNOSIS:   PARAESOPHAGEAL HIATAL HERNIA REFRACTORY TO MECICAL MANAGEMENT HISTORY OF CAMERON ULCERS WITH UPPER GI BLEEDING  PROCEDURE:   1. ROBOTIC reduction of paraesophageal hiatal hernia 2. Type II mediastinal dissection. 3. Primary repair of hiatal hernia over pledgets.  4. Anterior & posterior gastropexy. 5. Toupet (270 degree partial posterior x 4cm)  fundoplication 6. Mesh reinforcement with absorbable mesh  SURGEON:  Ardeth Sportsman, MD  ASSISTANT:  Wenda Low, MD  An experienced assistant was required given the standard of surgical care given the complexity of the case.  This assistant was needed for exposure, dissection, suction, tissue approximation, retraction, perception, etc  ANESTHESIA:  General endotracheal intubation anesthesia (GETA) and Regional TRANSVERSUS ABDOMINIS PLANE (TAP) nerve block -BILATERAL for perioperative & postoperative pain control at the level of the transverse abdominis & preperitoneal spaces along the flank at the anterior axillary line, from subcostal ridge to iliac crest under laparoscopic guidance provided with liposomal bupivacaine (Experel) 20mL mixed with 50 mL of bupivicaine 0.25% with epinephrine  Estimated Blood Loss (EBL):   Total I/O In: 1100 [I.V.:1000; IV Piggyback:100] Out: 90 [Blood:90].   (See anesthesia record)  Delay start of Pharmacological VTE agent (>24hrs) due to concerns of significant anemia, surgical blood loss, or risk of bleeding?:  no  DRAINS: (None) and 19 Fr Blake drain with tip resting in the mediastinum  SPECIMEN:  Hernia sac (not  sent)  DISPOSITION OF SPECIMEN:  Pathology  COUNTS:  Sponge, needle, & instrument counts CORRECT  PLAN OF CARE: Admit for overnight observation  PATIENT DISPOSITION:  PACU - hemodynamically stable.  INDICATION:   Patient with symptomatic paraesophageal hiatal hernia.  Severe anemia requiring transfusions with endoscopy showing Cameron ulcers and a chronically incarcerated hiatal hernia.  The patient has had extensive work-up & we feel the patient will benefit from repair:  The anatomy & physiology of the foregut and anti-reflux mechanism was discussed.  The pathophysiology of hiatal herniation and GERD was discussed.  Natural history risks without surgery was discussed.   The patient's symptoms are not adequately controlled by medicines and other non-operative treatments.  I feel the risks of no intervention will lead to serious problems that outweigh the operative risks; therefore, I recommended surgery to reduce the hiatal hernia out of the chest and fundoplication to rebuild the anti-reflux valve and control reflux better.  Need for a thorough workup to rule out the differential diagnosis and plan treatment was explained.  I explained laparoscopic techniques with possible need for an open approach.  Risks such as bleeding, infection, abscess, leak, need for further treatment, heart attack, death, and other risks were discussed.   I noted a good likelihood this will help address the problem.  Goals of post-operative recovery were discussed as well.  Possibility that this will not correct all symptoms was explained.  Post-operative dysphagia, need for short-term liquid & pureed diet, inability to vomit, possibility of reherniation, possible need for medicines to help control symptoms in addition to surgery were discussed.  We will work to minimize complications.   Educational handouts further explaining the pathology, treatment options, and dysphagia diet was given as well.  Questions were answered.  The patient expresses understanding & wishes to proceed with surgery.  OR FINDINGS:   Moderate-sized paraesophageal hiatal hernia with 25% of the stomach incarcerated in the mediastinum.  There was a 9 x 7 cm hiatal defect.  It is a primary repair over pledgets.  Mesh reinforcement was used with GORE BIO-A mesh, a biosynthetic web scaffold made of 67% polyglycolic acid (PGA): 33% trimethylene carbonate (TMC).  The patient has a Toupet (270 degree partial posterior x 4cm) fundoplication.  The patient has had anterior and posterior gastropexy.  DESCRIPTION:   Informed consent was confirmed.  The patient received IV antibiotics prior to incision.  The underwent general anesthesia without difficulty.  A Foley catheter sterilely placed.  The patient was positioned in split leg with arms tucked. The abdomen was prepped and draped in the sterile fashion.  Surgical time-out confirmed our plan.  I placed a 5 mm port in the left subcostal region using Varess entry technique with the patient in steep reverse Trendelenburg and left side up.  Entry was clean.  We induced carbon dioxide insufflation.  Camera inspection revealed no injury.  Under direct visualization, I placed extra ports.  I also placed a 5 mm port in the left subxiphoid region under direct visualization.  I removed that and placed an Omega-shaped rigid Nathanson liver retractor to lift the left lateral sector of the liver anteriorly to expose the esophageal hiatus.  This was secured to the bed using the iron man system.  The Xi robot was carefully docked and instruments placed and advanced under direct visualization.  We focused on dissection.  Got a moderate amount of greater omentum and some stomach out of the hernia sac but there was a suction cup affect returning back into the mediastinum.  We grasped the anterior mediastinal sac at the apex of the crus.  I scored through that and got into the anterior mediastinum.  I was able to free the  mediastinal sac from its attachments to the pericardium and bilateral pleura using primarily focused gentle blunt dissection as well as focused vessel sealer dissection.  I transected phrenoesophageal attachments to the inner right crus, preserving a two centimeter cuff of mediastinal sac until I found the base of the crura.  Patient had an enlarged phrenic vein on the apex of the right crus.  Try to skeletonize and transect with a vessel sealer.  Got recurrent bleeding with some retraction.  Was able to get a clamp on it.  Control bleeding with 2-0 Prolene figure-of-eight suture to good result.  Confirmed we were medial and away from the IVC.  Returned to crural dissection.  I then came around anteriorly on the left side and freed up the phrenoesophageal attachments of the mediastinal sac on the medial part of the left crus on the superior half.  I did careful mediastinal dissection to free the mediastinal sac.  With that, we could relieve the suction cup affect of the hernia sac and help reduce the stomach back down into the abdomen, flipped back approriately.    We ligated the short gastrics along the lesser curvature of the stomach about a third the way down and then came up proximally over the fundus.  Patient had inflammation with adhesions to the spleen that we carefully isolated dissected and freed off to avoid injury.  Freed off the left crus and retroperitoneum.  We released the attachments of the stomach to the retroperitoneum until we were able to connect with the prior dissection on the  left crus.  We completed the release of phrenoesophageal attachments to the medial part of the left crus down to its base.  With this, we had circumferential mobilization.  And up doing a releasing incision along the left diaphragm 6 cm lateral to the left crus in the sagittal plane to help bring some tension off it and bring the crura together better.  We freed the left lateral sector of the liver off the diaphragm  carefully to better expose the diaphragm and crura.  I held off on doing a right crural release especially with the phrenic vein requiring ligation.  We placed the stomach and esophagus on axial tension.  I then did a Type II mediastinal dissection where I freed the esophagus from its attachments to the aorta, spine, pleura, and pericardium using primarily gentle blunt as well as focused ultrasonic dissection.  Rather inflamed consistent with some chronic esophagitis and mediastinitis chronic.  And up going into the right pleura and releasing tension off the left crus.  That provided better relaxation.  Again dissected around the esophagus circumferentially.  We saw the anterior & posterior vagus nerves intact.  We preserved it at all times.  We proceeded to dissect about 20 cm proximally into the mediastinum.  With that I could straighten out the esophagus and get 5 cm of intra-abdominal length of the esophagus at a best estimation.  I freed the anterior mediastinal sac off the esophagus & stomach.  We saw the anterior vagus nerve and freed the sac off of the vagus.  I dissected out & removed the fatty  epiphrenic pads at the esophagogastric junction. With that, I could better define the esophagogastric junction.  I confirmed the the patient had 5 cm of intra-abdominal esophageal length off tension.  I brought the fundus of the stomach posterior to the esophagus over to the right side.  The wrap was mobile with the classic shoe shine maneuver.  Wrap became together gently.  We reflected the stomach left laterally and closed the esophageal hiatus using #1 Ethibond stitch using horizontal mattress stitches with pledgets on both sides.  I did that x2 stitches.  Did an additional horizontal mattress suture with #1 Ethibond to help bring the crura closure more snug around the esophagus with a 10 mm gap.  The crura had good substance and they came together well without any tension.  Because of the the defect  requiring a left crural release and a obese woman, I reinforced the repair using a Bio-A 10x7 cm biosynthetic precut mesh. We brought the mesh in and laid it over the crural repair, tails anterior over the crura.  I tucked the broader "U" tail of the mesh between the left diaphragm and the spleen, the narrower "U" tail over the right crus.  I secured to the left lateral and left superior sides of the broader "U" tail to the left diaphragm band with 2-0 V lock running suture.  Secured the narrow towel to the right crus using a running 2-0 V-lock suture as well  I brought the fundus of the stomach behind the esophagus and cardia to set up a fundoplication wrap.  I did a posterior gastropexy x3  by taking of #1 Ethibond interrupted stitches to the posterior part of the right side of the wrap and thru the mesh and crural closure. .  That way the stomach covered the mesh and protected it from any esophageal exposure.   I then did a right anterior gastropexy by taking  the right medial cephalad part of the stomach wrap to the apex of the right crura and out the Phasix mesh to help protect mesh exposure to the esophagus.  Did a similar stitch on the left side in a mirror image fashion.  With the anterior and posterior gastropexies, stomach laid well for a fundoplication wrap.  Given the esophagitis and inflammation with good esophageal length, I decided to do a classic 4cm Toupet fundoplication on the true esophagus above the cardia using 0 Ethibond stitch in the left superior side of the wrap, apex of the left inner crus then left anterior esophagus and tied that down to do a left anterior gastropexy.  Did a mirror-image stitch on the right side to do a right anterior gastropexy.  I then did 2 more distal pairs of suture between the inner part of the wrap and the anterolateral esophagus.  That way there were 3 total pairs of fundoplication sutures offering a posterior 270 degree wrap.  Measured at 4 cm.  Classic Toupet  fundoplication.    The wrap was soft and floppy.  I placed a drain as noted above.  I did irrigation and ensured hemostasis.  I saw no evidence of any leak or perforation or other abnormality.  I removed the [facility] liver retractor under direct visualization.  I evacuated carbon dioxide and removed the ports.  The skin was closed with Monocryl and sterile dressings applied.  The patient is being extubated and brought back to the recovery room.  I discussed postop care in detail with the patient and family in in the office.  Discussed again with the patient, her husband, and her daughter in the holding area. I discussed operative findings, updated the patient's status, discussed probable steps to recovery, and gave postoperative recommendations to the patient's spouse, Aavya Shafer .  Recommendations were made.  Questions were answered.  He expressed understanding & appreciation.   Ardeth Sportsman, M.D., F.A.C.S. Gastrointestinal and Minimally Invasive Surgery [facility] [address] [place], [place] [address] [phone] Main / Paging

## 2022-08-08 NOTE — Discharge Instructions (Signed)
EATING AFTER YOUR ESOPHAGEAL SURGERY (Stomach Fundoplication, Hiatal Hernia repair, Achalasia surgery, etc)  ######################################################################  EAT Start with a pureed / full liquid diet (see below) Gradually transition to a high fiber diet with a fiber supplement over the next month after discharge.    WALK Walk an hour a day.  Control your pain to do that.    CONTROL PAIN Control pain so that you can walk, sleep, tolerate sneezing/coughing, go up/down stairs.  HAVE A BOWEL MOVEMENT DAILY Keep your bowels regular to avoid problems.  OK to try a laxative to override constipation.  OK to use an antidairrheal to slow down diarrhea.  Call if not better after 2 tries  CALL IF YOU HAVE PROBLEMS/CONCERNS Call if you are still struggling despite following these instructions. Call if you have concerns not answered by these instructions  ######################################################################   After your esophageal surgery, expect some sticking with swallowing over the next 1-2 months.    If food sticks when you eat, it is called "dysphagia".  This is due to swelling around your esophagus at the wrap & hiatal diaphragm repair.  It will gradually ease off over the next few months.  To help you through this temporary phase, we start you out on a pureed (blenderized) diet.  Your first meal in the hospital was thin liquids.  You should have been given a pureed diet by the time you left the hospital.  We ask patients to stay on a pureed diet for the first 2-3 weeks to avoid anything getting "stuck" near your recent surgery.  Don't be alarmed if your ability to swallow doesn't progress according to this plan.  Everyone is different and some diets can advance more or less quickly.    It is often helpful to crush your medications or split them as they can sometimes stick, especially the first week or so.   Some BASIC RULES to follow  are:  Maintain an upright position whenever eating or drinking.  Take small bites - just a teaspoon size bite at a time.  Eat slowly.  It may also help to eat only one food at a time.  Consider nibbling through smaller, more frequent meals & avoid the urge to eat BIG meals  Do not push through feelings of fullness, nausea, or bloatedness  Do not mix solid foods and liquids in the same mouthful  Try not to "wash foods down" with large gulps of liquids.  Avoid carbonated (bubbly/fizzy) drinks.    Avoid foods that make you feel gassy or bloated.  Start with bland foods first.  Wait on trying greasy, fried, or spicy meals until you are tolerating more bland solids well.  Understand that it will be hard to burp and belch at first.  This gradually improves with time.  Expect to be more gassy/flatulent/bloated initially.  Walking will help your body manage it better.  Consider using medications for bloating that contain simethicone such as  Maalox or Gas-X   Consider crushing her medications, especially smaller pills.  The ability to swallow pills should get easier after a few weeks  Eat in a relaxed atmosphere & minimize distractions.  Avoid talking while eating.    Do not use straws.  Following each meal, sit in an upright position (90 degree angle) for 60 to 90 minutes.  Going for a short walk can help as well  If food does stick, don't panic.  Try to relax and let the food pass on its own.    Be gradual in changes & use common sense:  -If you easily tolerating a certain "level" of foods, advance to the next level gradually -If you are having trouble swallowing a particular food, then avoid it.   -If food is sticking when you advance your diet, go back to thinner previous diet (the lower LEVEL) for 1-2 days.  LEVEL 1 = PUREED DIET  Do for the first 2 WEEKS AFTER SURGERY  -Foods in this group are pureed or blenderized to a smooth, mashed  potato-like consistency.  -If necessary, the pureed foods can keep their shape with the addition of a thickening agent.   -Meat should be pureed to a smooth, pasty consistency.  Hot broth or gravy may be added to the pureed meat, approximately 1 oz. of liquid per 3 oz. serving of meat. -CAUTION:  If any foods do not puree into a smooth consistency, swallowing will be more difficult.  (For example, nuts or seeds sometimes do not blend well.)  Hot Foods Cold Foods  Pureed scrambled eggs and cheese Pureed cottage cheese  Baby cereals Thickened juices and nectars  Thinned cooked cereals (no lumps) Thickened milk or eggnog  Pureed [place] toast or pancakes Ensure  Mashed potatoes Ice cream  Pureed parsley, au gratin, scalloped potatoes, candied sweet potatoes Fruit or [place] ice, sherbet  Pureed buttered or alfredo noodles Plain yogurt  Pureed vegetables (no corn or peas) Instant breakfast  Pureed soups and creamed soups Smooth pudding, mousse, custard  Pureed scalloped apples Whipped gelatin  Gravies Sugar, syrup, honey, jelly  Sauces, cheese, tomato, barbecue, white, creamed Cream  Any baby food Creamer  Alcohol in moderation (not beer or champagne) Margarine  Coffee or tea Mayonnaise   Ketchup, mustard   Apple sauce   SAMPLE MENU:  PUREED DIET Breakfast Lunch Dinner  Orange juice, 1/2 cup Cream of wheat, 1/2 cup Pineapple juice, 1/2 cup Pureed [place], barley soup, 3/4 cup Pureed Hawaiian chicken, 3 oz  Scrambled eggs, mashed or blended with cheese, 1/2 cup Tea or coffee, 1 cup  Whole milk, 1 cup  Non-dairy creamer, 2 Tbsp. Mashed potatoes, 1/2 cup Pureed cooled broccoli, 1/2 cup Apple sauce, 1/2 cup Coffee or tea Mashed potatoes, 1/2 cup Pureed spinach, 1/2 cup Frozen yogurt, 1/2 cup Tea or coffee      LEVEL 2 = SOFT DIET  After your first 2 weeks, you can advance to a soft diet.   Keep on this diet until everything goes down easily.  Hot Foods Cold Foods  White fish  Cottage cheese  Stuffed fish Junior baby fruit  Baby food meals Semi thickened juices  Minced soft cooked, scrambled, poached eggs nectars  Souffle & omelets Ripe mashed bananas  Cooked cereals Canned fruit, pineapple sauce, milk  potatoes Milkshake  Buttered or Alfredo noodles Custard  Cooked cooled vegetable Puddings, including tapioca  Sherbet Yogurt  Vegetable soup or alphabet soup Fruit ice, [place] ice  Gravies Whipped gelatin  Sugar, syrup, honey, jelly Junior baby desserts  Sauces:  Cheese, creamed, barbecue, tomato, white Cream  Coffee or tea Margarine   SAMPLE MENU:  LEVEL 2 Breakfast Lunch Dinner  Orange juice, 1/2 cup Oatmeal, 1/2 cup Scrambled eggs with cheese, 1/2 cup Decaffeinated tea, 1 cup Whole milk, 1 cup Non-dairy creamer, 2 Tbsp Pineapple juice, 1/2 cup Minced beef, 3 oz Gravy, 2 Tbsp Mashed potatoes, 1/2 cup Minced fresh broccoli, 1/2 cup Applesauce, 1/2 cup Coffee, 1 cup [place], barley soup, 3/4 cup Minced Hawaiian chicken, 3 oz  Non-dairy creamer, 2 Tbsp  Pineapple juice, 1/2 cup  Minced beef, 3 oz  Gravy, 2 Tbsp  Mashed potatoes, 1/2 cup  Minced fresh broccoli, 1/2 cup  Applesauce, 1/2 cup  Coffee, 1 cup  [place], barley soup, 3/4 cup  Minced Hawaiian chicken, 3 oz  Mashed potatoes, 1/2 cup  Cooked spinach, 1/2 cup  Frozen yogurt, 1/2 cup  Non-dairy creamer, 2 Tbsp      LEVEL 3 = CHOPPED DIET  -After all the foods in level 2 (soft diet) are passing through well you should advance up to more chopped foods.  -It is still important to cut these foods into small pieces and eat slowly.  Hot Foods Cold Foods  Poultry Cottage cheese  Chopped Swedish meatballs Yogurt  Meat salads (ground or flaked meat) Milk  Flaked fish (tuna) Milkshakes  Poached or scrambled eggs Soft, cold, dry cereal  Souffles and omelets Fruit juices or nectars  Cooked cereals Chopped canned fruit  Chopped French toast or pancakes Canned fruit cocktail  Noodles or pasta (no rice) Pudding, mousse, custard  Cooked vegetables (no frozen peas, corn, or mixed vegetables) Green salad  Canned small sweet peas  Ice cream  Creamed soup or vegetable soup Fruit ice, Italian ice  Pureed vegetable soup or alphabet soup Non-dairy creamer  Ground scalloped apples Margarine  Gravies Mayonnaise  Sauces:  Cheese, creamed, barbecue, tomato, white Ketchup  Coffee or tea Mustard   SAMPLE MENU:  LEVEL 3 Breakfast Lunch Dinner   Orange juice, 1/2 cup  Oatmeal, 1/2 cup  Scrambled eggs with cheese, 1/2 cup  Decaffeinated tea, 1 cup  Whole milk, 1 cup  Non-dairy creamer, 2 Tbsp  Ketchup, 1 Tbsp  Margarine, 1 tsp  Salt, 1/4 tsp  Sugar, 2 tsp  Pineapple juice, 1/2 cup  Ground beef, 3 oz  Gravy, 2 Tbsp  Mashed potatoes, 1/2 cup  Cooked spinach, 1/2 cup  Applesauce, 1/2 cup  Decaffeinated coffee  Whole milk  Non-dairy creamer, 2 Tbsp  Margarine, 1 tsp  Salt, 1/4 tsp  Pureed turkey, barley soup, 3/4 cup  Barbecue chicken, 3 oz  Mashed potatoes, 1/2 cup  Ground fresh broccoli, 1/2 cup  Frozen yogurt, 1/2 cup  Decaffeinated tea, 1 cup  Non-dairy creamer, 2 Tbsp  Margarine, 1 tsp  Salt, 1/4 tsp  Sugar, 1 tsp    LEVEL 4:  REGULAR FOODS  -Foods in this group are soft, moist, regularly textured foods.   -This level includes meat and breads, which tend to be the hardest things to swallow.   -Eat very slowly, chew well and continue to avoid carbonated drinks. -most people are at this level in 4-6 weeks  Hot Foods Cold Foods  Baked fish or skinned Soft cheeses - cottage cheese  Souffles and omelets Cream cheese  Eggs Yogurt  Stuffed shells Milk  Spaghetti with meat sauce Milkshakes  Cooked cereal Cold dry cereals (no nuts, dried fruit, coconut)  French toast or pancakes Crackers  Buttered toast Fruit juices or nectars  Noodles or pasta (no rice) Canned fruit  Potatoes (all types) Ripe bananas  Soft, cooked vegetables (no corn, lima, or baked beans) Peeled, ripe, fresh fruit  Creamed soups or vegetable soup Cakes (no nuts, dried fruit, coconut)  Canned chicken  noodle soup Plain doughnuts  Gravies Ice cream  Bacon dressing Pudding, mousse, custard  Sauces:  Cheese, creamed, barbecue, tomato, white Fruit ice, Italian ice, sherbet  Decaffeinated tea or coffee Whipped gelatin  Pork chops Regular gelatin     as needed  6) May hold gluten/wheat products from diet to see if symptoms improve.  7) May try probiotics (Align, Activa, etc) to help calm the bowels down  7) If symptoms become worse call back immediately.    If you have any questions please call our office at [facility]: [phone].    ################################################################  LAPAROSCOPIC SURGERY: POST OP INSTRUCTIONS  ######################################################################  EAT Gradually transition to a high fiber diet with a fiber supplement over the next few weeks after discharge.  Start with a pureed / full liquid diet (see below)  WALK Walk an hour a day.  Control your pain to do that.    CONTROL PAIN Control pain so that you can walk, sleep, tolerate sneezing/coughing, go up/down stairs.  HAVE A BOWEL MOVEMENT DAILY Keep your bowels regular to avoid problems.  OK to try a laxative to override constipation.  OK to use an antidairrheal to slow down diarrhea.  Call if not better after 2 tries  CALL IF YOU HAVE PROBLEMS/CONCERNS Call if you are still struggling despite following these instructions. Call if you have concerns not answered by these instructions  ######################################################################    DIET: Follow a light bland diet & liquids the first 24 hours after arrival home, such as soup, liquids,  starches, etc.  Be sure to drink plenty of fluids.  Quickly advance to a usual solid diet within a few days.  Avoid fast food or heavy meals as your are more likely to get nauseated or have irregular bowels.  A low-fat, high-fiber diet for the rest of your life is ideal.  Take your usually prescribed home medications unless otherwise directed.  PAIN CONTROL: Pain is best controlled by a usual combination of three different methods TOGETHER: Ice/Heat Over the counter pain medication Prescription pain medication Most patients will experience some swelling and bruising around the incisions.  Ice packs or heating pads (30-60 minutes up to 6 times a day) will help. Use ice for the first few days to help decrease swelling and bruising, then switch to heat to help relax tight/sore spots and speed recovery.  Some people prefer to use ice alone, heat alone, alternating between ice & heat.  Experiment to what works for you.  Swelling and bruising can take several weeks to resolve.   It is helpful to take an over-the-counter pain medication regularly for the first few weeks.  Choose one of the following that works best for you: Naproxen (Aleve, etc)  Two 220mg  tabs twice a day Ibuprofen (Advil, etc) Three 200mg  tabs four times a day (every meal & bedtime) Acetaminophen (Tylenol, etc) 500-650mg  four times a day (every meal & bedtime) A  prescription for pain medication (such as oxycodone, hydrocodone, tramadol, gabapentin, methocarbamol, etc) should be given to you upon discharge.  Take your pain medication as prescribed.  If you are having problems/concerns with the prescription medicine (does not control pain, nausea, vomiting, rash, itching, etc), please call us [phone] to see if we need to switch you to a different pain medicine that will work better for you and/or control your side effect better. If you need a refill on your pain medication, please give [place] 48 hour notice.  contact your pharmacy.   They will contact our office to request authorization. Prescriptions will not be filled after 5 pm or on week-ends  AVOID GETTING CONSTIPATED.   a.  Between the surgery and the pain medications, it is common to experience some constipation.  b.  Drink plenty of liquids c   ake a fiber supplement 2 times day (such as Metamucil, Citrucel, FiberCon, MiraLax, etc) to have a bowel movement every day. d.  If you have not had a BM by 2 days after surgery: -drink liquids only until you have a bowel movement - take MiraLAX 2 doses every 2 hours until you have a bowel movement   Watch out for diarrhea.   If you have many loose bowel movements, simplify your diet to bland foods & liquids for a few days.   Stop any stool softeners and decrease your fiber supplement.   Switching to mild anti-diarrheal medications (Kayopectate, Pepto Bismol) can help.   If this worsens or does not improve, please call us.  Wash / shower every day.  You may shower over the dressings as they are waterproof.  Continue to shower over incision(s) after the dressing is off.  It is good for closed incisions and even open wounds to be washed every day.  Shower every day.  Short baths are fine.  Wash the incisions and wounds clean with soap & water.    You may leave closed incisions open to air if it is dry.   You may cover the incision with clean gauze & replace it after your daily shower for comfort.  TEGADERM:  You have clear gauze band-aid dressings over your closed incision(s).  Remove the dressings 3 days after surgery.    ACTIVITIES as tolerated:   You may resume regular (light) daily activities beginning the next day--such as daily self-care, walking, climbing stairs--gradually increasing activities as tolerated.  If you can walk 30 minutes without difficulty, it is safe to try more intense activity such as jogging, treadmill, bicycling, low-impact aerobics, swimming, etc. Save the most intensive and strenuous activity  for last such as sit-ups, heavy lifting, contact sports, etc  Refrain from any heavy lifting or straining until you are off narcotics for pain control.   DO NOT PUSH THROUGH PAIN.  Let pain be your guide: If it hurts to do something, don't do it.  Pain is your body warning you to avoid that activity for another week until the pain goes down. You may drive when you are no longer taking prescription pain medication, you can comfortably wear a seatbelt, and you can safely maneuver your car and apply brakes. You may have sexual intercourse when it is comfortable.  FOLLOW UP in our office Please call [facility] at [phone] to set up an appointment to see your surgeon in the office for a follow-up appointment approximately 2-3 weeks after your surgery. Make sure that you call for this appointment the day you arrive home to insure a convenient appointment time.  10. IF YOU HAVE DISABILITY OR FAMILY LEAVE FORMS, BRING THEM TO THE OFFICE FOR PROCESSING.  DO NOT GIVE THEM TO YOUR DOCTOR.   WHEN TO CALL us [phone]: Poor pain control Reactions / problems with new medications (rash/itching, nausea, etc)  Fever over 101.5 F (38.5 C) Inability to urinate Nausea and/or vomiting Worsening swelling or bruising Continued bleeding from incision. Increased pain, redness, or drainage from the incision   The clinic staff is available to answer your questions during regular business hours (8:30am-5pm).  Please don't hesitate to call and ask to speak to one of our nurses for clinical concerns.   If you have a medical emergency, go to the nearest emergency room or call 911.  A surgeon from [facility] is always  on call at the [facility], [place] [address], [address], [place], [place]  [address] ? MAIN: [phone] ? TOLL FREE: [phone] ?  FAX [phone]  [phone] [email or web address]  ##############################################################

## 2022-08-08 NOTE — Transfer of Care (Signed)
Immediate Anesthesia Transfer of Care Note  Patient: [name] [name]  Procedure(s) Performed: ROBOTIC REPAIR INCARCERATED HIATAL HERNIA REPAIR WITH MESH AND TOUPE FUNDOPLICATION  Patient Location: PACU  Anesthesia Type:General  Level of Consciousness: awake, drowsy, and patient cooperative  Airway & Oxygen Therapy: Patient Spontanous Breathing and Patient connected to face mask oxygen  Post-op Assessment: Report given to RN, Post -op Vital signs reviewed and stable, and Patient moving all extremities X 4  Post vital signs: Reviewed and stable  Last Vitals:  Vitals Value Taken Time  BP 128/69 08/08/22 1039  Temp    Pulse 79 08/08/22 1045  Resp 14 08/08/22 1045  SpO2 97 % 08/08/22 1045  Vitals shown include unvalidated device data.  Last Pain:  Vitals:   08/08/22 0618  TempSrc:   PainSc: 0-No pain         Complications: No notable events documented.

## 2022-08-08 NOTE — Anesthesia Procedure Notes (Signed)
Procedure Name: Intubation Date/Time: 08/08/2022 7:38 AM  Performed by: Shanon Payor, CRNAPre-anesthesia Checklist: Patient identified, Emergency Drugs available, Suction available, Patient being monitored and Timeout performed Patient Re-evaluated:Patient Re-evaluated prior to induction Oxygen Delivery Method: Circle system utilized Preoxygenation: Pre-oxygenation with 100% oxygen Induction Type: IV induction Ventilation: Mask ventilation without difficulty Laryngoscope Size: Mac and 3 Grade View: Grade I Tube type: Oral Tube size: 7.0 mm Number of attempts: 1 Placement Confirmation: ETT inserted through vocal cords under direct vision, positive ETCO2, CO2 detector and breath sounds checked- equal and bilateral Secured at: 22 cm Tube secured with: Tape Dental Injury: Teeth and Oropharynx as per pre-operative assessment

## 2022-08-08 NOTE — Interval H&P Note (Signed)
History and Physical Interval Note:  08/08/2022 6:41 AM  [name] [name]  has presented today for surgery, with the diagnosis of PARAESOPHAGEAL HIATAL HERNIA REFRACTORY TO MECICAL MANAGEMENT.  The various methods of treatment have been discussed with the patient and family. After consideration of risks, benefits and other options for treatment, the patient has consented to  Procedure(s): ROBOTIC REPAIR OF PARAESOPHAGEAL HIATAL HERNIA WITH FUNDOPLICATION (N/A) as a surgical intervention.  The patient's history has been reviewed, patient examined, no change in status, stable for surgery.  I have reviewed the patient's chart and labs.  Questions were answered to the patient's satisfaction.    I have re-reviewed the the patient's records, history, medications, and allergies.  I have re-examined the patient.  I again discussed intraoperative plans and goals of post-operative recovery.  The patient agrees to proceed.  [name] [name]  Dec 30, 1975 [id number]  Patient Care Team: Caffie Damme, MD as PCP - General (Family Medicine) Karie Soda, MD as Consulting Physician (General Surgery) Delbert Harness, MD (Inactive) as Consulting Physician (Obstetrics and Gynecology)  Patient Active Problem List   Diagnosis Date Noted   Coccyx contusion 07/05/2015   Left ankle injury 11/24/2013   Left knee pain 05/03/2013   Low back pain 09/23/2011    Past Medical History:  Diagnosis Date   Anemia    Endometriosis    GERD (gastroesophageal reflux disease)    No pertinent past medical history     Past Surgical History:  Procedure Laterality Date   ABDOMINAL HYSTERECTOMY     CYSTOSCOPY  02/05/2012   Procedure: CYSTOSCOPY;  Surgeon: Delbert Harness, MD;  Location: [facility];  Service: Gynecology;  Laterality: N/A;   LAPAROSCOPIC HYSTERECTOMY  02/05/2012   Procedure: HYSTERECTOMY TOTAL LAPAROSCOPIC;  Surgeon: Delbert Harness, MD;  Location: [facility];  Service: Gynecology;  Laterality: N/A;   NO PAST SURGERIES       Social History   Socioeconomic History   Marital status: Married    Spouse name: Not on file   Number of children: Not on file   Years of education: Not on file   Highest education level: Not on file  Occupational History   Not on file  Tobacco Use   Smoking status: Never   Smokeless tobacco: Never  Vaping Use   Vaping Use: Never used  Substance and Sexual Activity   Alcohol use: Never   Drug use: No   Sexual activity: Yes    Birth control/protection: Surgical  Other Topics Concern   Not on file  Social History Narrative   Not on file   Social Determinants of Health   Financial Resource Strain: Not on file  Food Insecurity: Not on file  Transportation Needs: Not on file  Physical Activity: Not on file  Stress: Not on file  Social Connections: Not on file  Intimate Partner Violence: Not on file    Family History  Problem Relation Age of Onset   Sudden death Father    Heart attack Father    Diabetes Neg Hx    Hyperlipidemia Neg Hx    Hypertension Neg Hx     Medications Prior to Admission  Medication Sig Dispense Refill Last Dose   acetaminophen (TYLENOL) 500 MG tablet Take 1,000 mg by mouth every 6 (six) hours as needed for moderate pain.   08/07/2022   ferrous sulfate 325 (65 FE) MG EC tablet Take 325 mg by mouth 2 (two) times daily.   Past Week   pantoprazole (PROTONIX) 40  MG tablet Take 40 mg by mouth 2 (two) times daily.   Past Week   sucralfate (CARAFATE) 1 g tablet Take 1 tablet (1 g total) by mouth 4 (four) times daily -  with meals and at bedtime. 20 tablet 0 Past Month    Current Facility-Administered Medications  Medication Dose Route Frequency Provider Last Rate Last Admin   bupivacaine liposome (EXPAREL) 1.3 % injection 266 mg  20 mL Infiltration Once Karie Soda, MD       cefTRIAXone (ROCEPHIN) 2 g in sodium chloride 0.9 % 100 mL IVPB  2 g Intravenous On Call to OR Karie Soda, MD       Chlorhexidine Gluconate Cloth 2 % PADS 6 each  6 each  Topical Once Karie Soda, MD       And   Chlorhexidine Gluconate Cloth 2 % PADS 6 each  6 each Topical Once Karie Soda, MD       dexamethasone (DECADRON) injection 4 mg  4 mg Intravenous On Call to OR Karie Soda, MD       lactated ringers infusion   Intravenous Continuous Bethena Midget, MD       scopolamine (TRANSDERM-SCOP) 1 MG/3DAYS 1.5 mg  1 patch Transdermal On Call to OR Karie Soda, MD   1.5 mg at 08/08/22 8413     Allergies  Allergen Reactions   Nsaids Other (See Comments)    Esophageal ULCERS with bleeding    BP (!) 149/87   Pulse 77   Temp 98.7 F (37.1 C) (Oral)   Resp 18   Ht 5\' 7"  (1.702 m)   Wt 106 kg   LMP 01/21/2012   SpO2 98%   BMI 36.60 kg/m   Labs: No results found for this or any previous visit (from the past 48 hour(s)).  Imaging / Studies: No results found.   Ardeth Sportsman, M.D., F.A.C.S. Gastrointestinal and Minimally Invasive Surgery [facility] [address], [address] [place], [place] [address] [phone] Main / Paging  08/08/2022 6:41 AM    Ardeth Sportsman

## 2022-08-08 NOTE — Anesthesia Postprocedure Evaluation (Signed)
Anesthesia Post Note  Patient: [name] [name]  Procedure(s) Performed: ROBOTIC REPAIR INCARCERATED HIATAL HERNIA REPAIR WITH MESH AND TOUPE FUNDOPLICATION     Patient location during evaluation: PACU Anesthesia Type: General Level of consciousness: awake and alert, oriented and patient cooperative Pain management: pain level controlled Vital Signs Assessment: post-procedure vital signs reviewed and stable Respiratory status: spontaneous breathing, nonlabored ventilation and respiratory function stable Cardiovascular status: blood pressure returned to baseline and stable Postop Assessment: no apparent nausea or vomiting Anesthetic complications: no   No notable events documented.  Last Vitals:  Vitals:   08/08/22 1404 08/08/22 1457  BP: (!) 153/105 (!) 141/81  Pulse: 71 71  Resp:    Temp:    SpO2: 93% 95%    Last Pain:  Vitals:   08/08/22 1338  TempSrc:   PainSc: 7                  Lannie Fields

## 2022-08-09 ENCOUNTER — Observation Stay (HOSPITAL_COMMUNITY): Payer: Managed Care, Other (non HMO)

## 2022-08-09 DIAGNOSIS — Z8711 Personal history of peptic ulcer disease: Secondary | ICD-10-CM | POA: Diagnosis not present

## 2022-08-09 DIAGNOSIS — Z90710 Acquired absence of both cervix and uterus: Secondary | ICD-10-CM | POA: Diagnosis not present

## 2022-08-09 DIAGNOSIS — Z6836 Body mass index (BMI) 36.0-36.9, adult: Secondary | ICD-10-CM | POA: Diagnosis not present

## 2022-08-09 DIAGNOSIS — K2100 Gastro-esophageal reflux disease with esophagitis, without bleeding: Secondary | ICD-10-CM | POA: Diagnosis present

## 2022-08-09 DIAGNOSIS — Z8249 Family history of ischemic heart disease and other diseases of the circulatory system: Secondary | ICD-10-CM | POA: Diagnosis not present

## 2022-08-09 DIAGNOSIS — Z886 Allergy status to analgesic agent status: Secondary | ICD-10-CM | POA: Diagnosis not present

## 2022-08-09 DIAGNOSIS — Z6830 Body mass index (BMI) 30.0-30.9, adult: Secondary | ICD-10-CM | POA: Diagnosis not present

## 2022-08-09 DIAGNOSIS — Z87891 Personal history of nicotine dependence: Secondary | ICD-10-CM | POA: Diagnosis not present

## 2022-08-09 DIAGNOSIS — D500 Iron deficiency anemia secondary to blood loss (chronic): Secondary | ICD-10-CM | POA: Diagnosis present

## 2022-08-09 DIAGNOSIS — Z79890 Hormone replacement therapy (postmenopausal): Secondary | ICD-10-CM | POA: Diagnosis not present

## 2022-08-09 DIAGNOSIS — K253 Acute gastric ulcer without hemorrhage or perforation: Secondary | ICD-10-CM | POA: Diagnosis present

## 2022-08-09 DIAGNOSIS — M545 Low back pain, unspecified: Secondary | ICD-10-CM | POA: Diagnosis present

## 2022-08-09 DIAGNOSIS — K279 Peptic ulcer, site unspecified, unspecified as acute or chronic, without hemorrhage or perforation: Secondary | ICD-10-CM | POA: Diagnosis present

## 2022-08-09 DIAGNOSIS — R1310 Dysphagia, unspecified: Secondary | ICD-10-CM | POA: Diagnosis present

## 2022-08-09 DIAGNOSIS — J9851 Mediastinitis: Secondary | ICD-10-CM | POA: Diagnosis present

## 2022-08-09 DIAGNOSIS — E669 Obesity, unspecified: Secondary | ICD-10-CM | POA: Diagnosis present

## 2022-08-09 DIAGNOSIS — K440 Diaphragmatic hernia with obstruction, without gangrene: Secondary | ICD-10-CM | POA: Diagnosis present

## 2022-08-09 DIAGNOSIS — Z79899 Other long term (current) drug therapy: Secondary | ICD-10-CM | POA: Diagnosis not present

## 2022-08-09 DIAGNOSIS — M1990 Unspecified osteoarthritis, unspecified site: Secondary | ICD-10-CM | POA: Diagnosis present

## 2022-08-09 MED ORDER — IOHEXOL 300 MG/ML  SOLN
50.0000 mL | Freq: Once | INTRAMUSCULAR | Status: AC | PRN
  Administered 2022-08-09: 45 mL via ORAL

## 2022-08-09 NOTE — Progress Notes (Signed)
1 Day Post-Op   Subjective/Chief Complaint: Patient reports fairly intense pain in the abdomen and back when she got up to ambulate this morning.  Now better back in bed. Having pain when swallowing   Objective: Vital signs in last 24 hours: Temp:  [97.5 F (36.4 C)-98.4 F (36.9 C)] 97.7 F (36.5 C) (06/22 0510) Pulse Rate:  [64-93] 85 (06/22 0510) Resp:  [11-19] 17 (06/22 0510) BP: (128-159)/(69-105) 145/87 (06/22 0510) SpO2:  [93 %-97 %] 97 % (06/22 0510) Last BM Date : 08/08/22  Intake/Output from previous day: 06/21 0701 - 06/22 0700 In: 3102.4 [P.O.:1110; I.V.:1892.4; IV Piggyback:100] Out: 795 [Urine:600; Drains:105; Blood:90] Intake/Output this shift: Total I/O In: 120 [P.O.:120] Out: 310 [Urine:300; Drains:10]  Exam: Awake and alert Looks comfortable now Abdomen soft, appropriately tender, drain serosang  Lab Results:  No results for input(s): "WBC", "HGB", "HCT", "PLT" in the last 72 hours. BMET No results for input(s): "NA", "K", "CL", "CO2", "GLUCOSE", "BUN", "CREATININE", "CALCIUM" in the last 72 hours. PT/INR No results for input(s): "LABPROT", "INR" in the last 72 hours. ABG No results for input(s): "PHART", "HCO3" in the last 72 hours.  Invalid input(s): "PCO2", "PO2"  Studies/Results: No results found.  Anti-infectives: Anti-infectives (From admission, onward)    Start     Dose/Rate Route Frequency Ordered Stop   08/08/22 0600  cefTRIAXone (ROCEPHIN) 2 g in sodium chloride 0.9 % 100 mL IVPB        2 g 200 mL/hr over 30 Minutes Intravenous On call to O.R. 08/08/22 0540 08/08/22 0757       Assessment/Plan: s/p Procedure(s): ROBOTIC REPAIR INCARCERATED HIATAL HERNIA REPAIR WITH MESH AND TOUPE FUNDOPLICATION (N/A)  For Esophogram this morning.  Advance diet later pending results. Given discomfort this morning and episode of severe pain, she will not be ready for discharge today.  Drain output 115 cc since surgery so will leave in  today     Abigail Miyamoto MD 08/09/2022

## 2022-08-10 NOTE — Progress Notes (Signed)
2 Days Post-Op   Subjective/Chief Complaint: Still complains of abd pain. Unchanged from yesterday. No vomiting. UGI showed no leak but did have dilated stomach   Objective: Vital signs in last 24 hours: Temp:  [97.7 F (36.5 C)-98 F (36.7 C)] 98 F (36.7 C) (06/23 0535) Pulse Rate:  [53-84] 53 (06/23 0624) Resp:  [20] 20 (06/23 0535) BP: (130-170)/(80-101) 130/86 (06/23 0624) SpO2:  [96 %-99 %] 98 % (06/23 0535) Last BM Date : 08/08/22  Intake/Output from previous day: 06/22 0701 - 06/23 0700 In: 720 [P.O.:720] Out: 390 [Urine:300; Drains:90] Intake/Output this shift: No intake/output data recorded.  General appearance: alert and cooperative Resp: clear to auscultation bilaterally Cardio: regular rate and rhythm GI: soft, moderate tenderness. Good bs. Incisions look good. Drain output serosanguinous  Lab Results:  No results for input(s): "WBC", "HGB", "HCT", "PLT" in the last 72 hours. BMET No results for input(s): "NA", "K", "CL", "CO2", "GLUCOSE", "BUN", "CREATININE", "CALCIUM" in the last 72 hours. PT/INR No results for input(s): "LABPROT", "INR" in the last 72 hours. ABG No results for input(s): "PHART", "HCO3" in the last 72 hours.  Invalid input(s): "PCO2", "PO2"  Studies/Results: DG ESOPHAGUS W SINGLE CM (SOL OR THIN BA)  Result Date: 08/09/2022 CLINICAL DATA:  47 year old female1 day postop paraesophageal hiatal hernia repair and fundoplication. Evaluate for post op leak. EXAM: WATER SOLUBLE ESOPHAGRAM TECHNIQUE: Single contrast examination was performed using 50 mL of water-soluble contrast. This exam was performed by Lawernce Ion, PA-C, and was supervised and interpreted by Roque Lias, MD. FLUOROSCOPY: Radiation Exposure Index (as provided by the fluoroscopic device): 25.1 mGy Kerma COMPARISON:  None Available. FINDINGS: Surgical drain is seen in the epigastric region. Extrinsic compression is seen on the gastric cardia and GE junction from fundoplication wrap.  Contrast passes freely through the esophagus and into the stomach. No evidence of contrast leak or extravasation. No other significant abnormality identified. Stomach is markedly enlarged, possible gastric outlet obstruction. IMPRESSION: 1. Expected postop findings from recent fundoplication. No evidence of postoperative leak or obstruction. 2. Severe gastric distension concerning for possible gastric outlet obstruction. These results will be called to the ordering clinician or representative by the Radiologist Assistant, and communication documented in the PACS or zVision Dashboard. Electronically Signed   By: Lupita Raider M.D.   On: 08/09/2022 10:06    Anti-infectives: Anti-infectives (From admission, onward)    Start     Dose/Rate Route Frequency Ordered Stop   08/08/22 0600  cefTRIAXone (ROCEPHIN) 2 g in sodium chloride 0.9 % 100 mL IVPB        2 g 200 mL/hr over 30 Minutes Intravenous On call to O.R. 08/08/22 0540 08/08/22 0757       Assessment/Plan: s/p Procedure(s): ROBOTIC REPAIR INCARCERATED HIATAL HERNIA REPAIR WITH MESH AND TOUPE FUNDOPLICATION (N/A) Stay on full liquids for now POD 2 repair hiatal hernia Ambulate Vte-lovenox, scd  LOS: 1 day    Chevis Pretty III 08/10/2022

## 2022-08-11 ENCOUNTER — Encounter (HOSPITAL_COMMUNITY): Payer: Self-pay | Admitting: Surgery

## 2022-08-11 DIAGNOSIS — Z8711 Personal history of peptic ulcer disease: Secondary | ICD-10-CM

## 2022-08-11 LAB — BASIC METABOLIC PANEL
Anion gap: 8 (ref 5–15)
BUN: 13 mg/dL (ref 6–20)
CO2: 27 mmol/L (ref 22–32)
Calcium: 9.5 mg/dL (ref 8.9–10.3)
Chloride: 104 mmol/L (ref 98–111)
Creatinine, Ser: 0.75 mg/dL (ref 0.44–1.00)
GFR, Estimated: >60 mL/min (ref >60)
Glucose, Bld: 108 mg/dL — ABNORMAL HIGH (ref 70–99)
Potassium: 3.9 mmol/L (ref 3.5–5.1)
Sodium: 139 mmol/L (ref 135–145)

## 2022-08-11 LAB — CBC
HCT: 35 % — ABNORMAL LOW (ref 36.0–46.0)
Hemoglobin: 10.2 g/dL — ABNORMAL LOW (ref 12.0–15.0)
MCH: 24.3 pg — ABNORMAL LOW (ref 26.0–34.0)
MCHC: 29.1 g/dL — ABNORMAL LOW (ref 30.0–36.0)
MCV: 83.5 fL (ref 80.0–100.0)
Platelets: 421 10*3/uL — ABNORMAL HIGH (ref 150–400)
RBC: 4.19 MIL/uL (ref 3.87–5.11)
RDW: 22.7 % — ABNORMAL HIGH (ref 11.5–15.5)
WBC: 20.8 10*3/uL — ABNORMAL HIGH (ref 4.0–10.5)
nRBC: 0 % (ref 0.0–0.2)

## 2022-08-11 LAB — MAGNESIUM: Magnesium: 2.1 mg/dL (ref 1.7–2.4)

## 2022-08-11 MED ORDER — METOCLOPRAMIDE HCL 5 MG/ML IJ SOLN: 5.0000 mg | Freq: Three times a day (TID) | INTRAMUSCULAR | Status: DC | PRN

## 2022-08-11 MED ORDER — METHOCARBAMOL 500 MG PO TABS
1000.0000 mg | ORAL_TABLET | Freq: Three times a day (TID) | ORAL | Status: DC
  Administered 2022-08-11 – 2022-08-12 (×4): 1000 mg via ORAL
  Filled 2022-08-11 (×4): qty 2

## 2022-08-11 MED ORDER — SIMETHICONE 80 MG PO CHEW
80.0000 mg | CHEWABLE_TABLET | Freq: Four times a day (QID) | ORAL | Status: DC
  Administered 2022-08-11 – 2022-08-12 (×5): 80 mg via ORAL
  Filled 2022-08-11 (×5): qty 1

## 2022-08-11 MED ORDER — PANTOPRAZOLE SODIUM 40 MG PO TBEC
40.0000 mg | DELAYED_RELEASE_TABLET | Freq: Every day | ORAL | Status: DC
  Administered 2022-08-11 – 2022-08-12 (×2): 40 mg via ORAL
  Filled 2022-08-11 (×2): qty 1

## 2022-08-11 MED ORDER — DEXAMETHASONE SODIUM PHOSPHATE 4 MG/ML IJ SOLN
4.0000 mg | Freq: Two times a day (BID) | INTRAMUSCULAR | Status: DC
  Administered 2022-08-11 – 2022-08-12 (×3): 4 mg via INTRAVENOUS
  Filled 2022-08-11 (×3): qty 1

## 2022-08-11 MED ORDER — GABAPENTIN 400 MG PO CAPS
400.0000 mg | ORAL_CAPSULE | Freq: Three times a day (TID) | ORAL | Status: DC
  Administered 2022-08-11 – 2022-08-12 (×4): 400 mg via ORAL
  Filled 2022-08-11 (×4): qty 1

## 2022-08-11 MED ORDER — POLYETHYLENE GLYCOL 3350 17 G PO PACK
17.0000 g | PACK | Freq: Two times a day (BID) | ORAL | Status: DC
  Administered 2022-08-11 – 2022-08-12 (×3): 17 g via ORAL
  Filled 2022-08-11 (×3): qty 1

## 2022-08-11 MED ORDER — OXYCODONE HCL 5 MG PO TABS
10.0000 mg | ORAL_TABLET | ORAL | Status: DC | PRN
  Administered 2022-08-11 – 2022-08-12 (×5): 15 mg via ORAL
  Filled 2022-08-11 (×5): qty 3

## 2022-08-11 MED ORDER — BISACODYL 10 MG RE SUPP
10.0000 mg | Freq: Every day | RECTAL | Status: DC
  Administered 2022-08-11: 10 mg via RECTAL
  Filled 2022-08-11 (×2): qty 1

## 2022-08-11 MED ORDER — METOCLOPRAMIDE HCL 5 MG PO TABS
5.0000 mg | ORAL_TABLET | Freq: Three times a day (TID) | ORAL | Status: DC
  Administered 2022-08-11 – 2022-08-12 (×4): 5 mg via ORAL
  Filled 2022-08-11 (×4): qty 1

## 2022-08-11 NOTE — Progress Notes (Signed)
   08/11/22 0959  TOC Brief Assessment  Insurance and Status Reviewed  Patient has primary care physician Yes  Home environment has been reviewed home with spouse  Prior level of function: independent  Prior/Current Home Services No current home services  Social Determinants of Health Reivew SDOH reviewed no interventions necessary  Readmission risk has been reviewed Yes  Transition of care needs no transition of care needs at this time

## 2022-08-11 NOTE — Progress Notes (Signed)
08/11/2022  [name] [name] [id number] December 13, 1975  CARE TEAM: PCP: Caffie Damme, MD  Outpatient Care Team: Patient Care Team: Caffie Damme, MD as PCP - General (Family Medicine) Karie Soda, MD as Consulting Physician (General Surgery) Delbert Harness, MD (Inactive) as Consulting Physician (Obstetrics and Gynecology)  Inpatient Treatment Team: Treatment Team: Attending Provider: Karie Soda, MD; Case Manager: Redmond Baseman, RN; Pharmacist: Cindi Carbon, [facility]; Charge Nurse: Saddie Benders, RN; Utilization Review: Helyn Numbers, RN   Problem List:   Principal Problem:   Incarcerated hiatal hernia Active Problems:   Low back pain   BMI 30.0-30.9,adult   Iron deficiency anemia due to chronic blood loss   History of gastric ulcer   08/08/2022  POST-OPERATIVE DIAGNOSIS:   PARAESOPHAGEAL HIATAL HERNIA REFRACTORY TO MECICAL MANAGEMENT HISTORY OF CAMERON ULCERS WITH UPPER GI BLEEDING   PROCEDURE:   1. ROBOTIC reduction of paraesophageal hiatal hernia 2. Type II mediastinal dissection. 3. Primary repair of hiatal hernia over pledgets.  4. Anterior & posterior gastropexy. 5. Toupet (270 degree partial posterior x 4cm)  fundoplication 6. Mesh reinforcement with absorbable mesh   SURGEON:  Ardeth Sportsman, MD  OR FINDINGS:  Moderate-sized paraesophageal hiatal hernia with 25% of the stomach incarcerated in the mediastinum.  There was a 9 x 7 cm hiatal defect.   It is a primary repair over pledgets.  Mesh reinforcement was used with GORE BIO-A mesh, a biosynthetic web scaffold made of 67% polyglycolic acid (PGA): 33% trimethylene carbonate (TMC).   The patient has a Toupet (270 degree partial posterior x 4cm) fundoplication.  The patient has had anterior and posterior gastropexy.   Assessment [facility] Stay = 2 days) 3 Days Post-Op    Stable but struggling with soreness    Plan:  -No evidence of any esophogastric junction obstruction, cardiac nor pulmonary  issues.  Films concerning for gastric bloating. Scheduled simethicone & metoclopramide for the next 48 hours Increase oxycodone.  Continue scheduled Tylenol.  Increase scheduled gabapentin.  Add Robaxin. -Dysphagia 1 pured diet. -History of gastric ulcers and Cameron ulcerations.  Keep on PPI.  Just do daily for now. -Check labs. -Bowel regimen.  Had some scheduled MiraLAX and suppositories to help in case she is feeling constipated. -No notes of any respiratory or pulmonary decline at this time.  Continue mobilization. -Drain shows no evidence of any leak.  No leak or obstruction.  Remove drain.  See if that helps.  She may have referred pain from that.  We will see. -VTE prophylaxis- SCDs, etc -mobilize as tolerated to help recovery -Disposition:  Disposition:  The patient is from: Home Anticipate discharge to:  Home Anticipated Date of Discharge is:  June 26,2024   Barriers to discharge:  Pending Clinical improvement (more likely than not)  Patient currently is NOT MEDICALLY STABLE for discharge from the hospital from a surgery standpoint.      I reviewed nursing notes, last 24 h vitals and pain scores, last 48 h intake and output, last 24 h labs and trends, and last 24 h imaging results.  I have reviewed this patient's available data, including medical history, events of note, test results, etc as part of my evaluation.   A significant portion of that time was spent in counseling. Care during the described time interval was provided by me.  This care required moderate level of medical decision making.  08/11/2022    Subjective: (Chief complaint)  Patient still struggling with a lot of referred back and  shoulder pain and pressure and discomfort.  Uncertain when she is taking.  (On scheduled Tylenol and gabapentin).  IV Dilaudid helps but does not last as long.  Walking a lot.  Felt mild relief with some flatus.  No major belching at this time.  Tolerating full  liquids.  Family in room.  Objective:  Vital signs:  Vitals:   08/10/22 0624 08/10/22 1316 08/10/22 2200 08/11/22 0521  BP: 130/86 (!) 140/84 132/82 (!) 142/96  Pulse: (!) 53 77 78 94  Resp:  18 17 14   Temp:  98.5 F (36.9 C) 97.9 F (36.6 C) 98 F (36.7 C)  TempSrc:  Oral Oral Oral  SpO2:  95% 97% 93%  Weight:      Height:        Last BM Date : 08/08/22 (VERY small smear today -- not counting it)  Intake/Output   Yesterday:  06/23 0701 - 06/24 0700 In: 1370 [P.O.:1260; IV Piggyback:110] Out: 855 [Urine:800; Drains:55] This shift:  No intake/output data recorded.  Bowel function:  Flatus: YES  BM:  No  Drain: Serosanguinous   Physical Exam:  General: Pt awake/alert in mild acute distress.  Not toxic nor sickly just restless and uncomfortable. Eyes: PERRL, normal EOM.  Sclera clear.  No icterus Neuro: CN II-XII intact w/o focal sensory/motor deficits. Lymph: No head/neck/groin lymphadenopathy Psych:  No delerium/psychosis/paranoia.  Oriented x 4 HENT: Normocephalic, Mucus membranes moist.  No thrush Neck: Supple, No tracheal deviation.  No obvious thyromegaly Chest: No pain to chest wall compression.  Good respiratory excursion.  No audible wheezing CV:  Pulses intact.  Regular rhythm.  No major extremity edema MS: Normal AROM mjr joints.  No obvious deformity  Abdomen: Soft.  Mildy distended.  Mildly tender at incisions only.  No evidence of peritonitis.  No incarcerated hernias.  Ext:   No deformity.  No mjr edema.  No cyanosis Skin: No petechiae / purpurea.  No major sores.  Warm and dry    Results:   Cultures: No results found for this or any previous visit (from the past 720 hour(s)).  Labs: No results found for this or any previous visit (from the past 48 hour(s)).  Imaging / Studies: DG ESOPHAGUS W SINGLE CM (SOL OR THIN BA)  Result Date: 08/09/2022 CLINICAL DATA:  47 year old female1 day postop paraesophageal hiatal hernia repair and  fundoplication. Evaluate for post op leak. EXAM: WATER SOLUBLE ESOPHAGRAM TECHNIQUE: Single contrast examination was performed using 50 mL of water-soluble contrast. This exam was performed by Lawernce Ion, PA-C, and was supervised and interpreted by Roque Lias, MD. FLUOROSCOPY: Radiation Exposure Index (as provided by the fluoroscopic device): 25.1 mGy Kerma COMPARISON:  None Available. FINDINGS: Surgical drain is seen in the epigastric region. Extrinsic compression is seen on the gastric cardia and GE junction from fundoplication wrap. Contrast passes freely through the esophagus and into the stomach. No evidence of contrast leak or extravasation. No other significant abnormality identified. Stomach is markedly enlarged, possible gastric outlet obstruction. IMPRESSION: 1. Expected postop findings from recent fundoplication. No evidence of postoperative leak or obstruction. 2. Severe gastric distension concerning for possible gastric outlet obstruction. These results will be called to the ordering clinician or representative by the Radiologist Assistant, and communication documented in the PACS or zVision Dashboard. Electronically Signed   By: Lupita Raider M.D.   On: 08/09/2022 10:06    Medications / Allergies: per chart  Antibiotics: Anti-infectives (From admission, onward)    Start  Dose/Rate Route Frequency Ordered Stop   08/08/22 0600  cefTRIAXone (ROCEPHIN) 2 g in sodium chloride 0.9 % 100 mL IVPB        2 g 200 mL/hr over 30 Minutes Intravenous On call to O.R. 08/08/22 0540 08/08/22 0757         Note: Portions of this report may have been transcribed using voice recognition software. Every effort was made to ensure accuracy; however, inadvertent computerized transcription errors may be present.   Any transcriptional errors that result from this process are unintentional.    Ardeth Sportsman, MD, FACS, MASCRS Esophageal, Gastrointestinal & Colorectal Surgery Robotic and Minimally  Invasive Surgery  [facility] [address] [place], [place] [address] [phone] Fax [phone] Main  CONTACT INFORMATION:  Weekday (9AM-5PM): Call [facility] main office at [phone]  Weeknight (5PM-9AM) or Weekend/Holiday: Check [email or web address] (password " [email or web address]") for General Surgery [facility] coverage  (Please, do not use SecureChat as it is not reliable communication to reach operating surgeons for immediate patient care given surgeries/outpatient duties/clinic/cross-coverage/off post-call which would lead to a delay in care.  Epic staff messaging available for outptient concerns, but may not be answered for 48 hours or more).     08/11/2022  8:07 AM

## 2022-08-12 MED ORDER — METOCLOPRAMIDE HCL 5 MG PO TABS: 5.0000 mg | ORAL_TABLET | Freq: Four times a day (QID) | ORAL | 2 refills | Status: AC | PRN

## 2022-08-12 MED ORDER — ONDANSETRON HCL 4 MG PO TABS: 4.0000 mg | ORAL_TABLET | Freq: Three times a day (TID) | ORAL | 10 refills | Status: AC | PRN

## 2022-08-12 MED ORDER — OXYCODONE HCL 5 MG PO TABS: 5.0000 mg | ORAL_TABLET | Freq: Four times a day (QID) | ORAL | 0 refills | Status: DC | PRN

## 2022-08-12 MED ORDER — PANTOPRAZOLE SODIUM 40 MG PO TBEC: 40.0000 mg | DELAYED_RELEASE_TABLET | Freq: Every day | ORAL | 0 refills | Status: AC

## 2022-08-12 NOTE — Discharge Summary (Addendum)
Physician Discharge Summary    Patient ID: [name] [name] MRN: [id number] DOB/AGE: 09/23/1975  47 y.o.  Patient Care Team: Caffie Damme, MD as PCP - General (Family Medicine) Karie Soda, MD as Consulting Physician (General Surgery) Delbert Harness, MD (Inactive) as Consulting Physician (Obstetrics and Gynecology)  Admit date: 08/08/2022  Discharge date: 08/12/2022  Hospital Stay = 3 days    Discharge Diagnoses:  Principal Problem:   Incarcerated hiatal hernia Active Problems:   Low back pain   BMI 30.0-30.9,adult   Iron deficiency anemia due to chronic blood loss   History of gastric ulcer   4 Days Post-Op  08/08/2022  POST-OPERATIVE DIAGNOSIS:   PARAESOPHAGEAL HIATAL HERNIA REFRACTORY TO MECICAL MANAGEMENT HISTORY OF CAMERON ULCERS WITH UPPER GI BLEEDING   PROCEDURE:   1. ROBOTIC reduction of paraesophageal hiatal hernia 2. Type II mediastinal dissection. 3. Primary repair of hiatal hernia over pledgets.  4. Anterior & posterior gastropexy. 5. Toupet (270 degree partial posterior x 4cm)  fundoplication 6. Mesh reinforcement with absorbable mesh   SURGEON:  Ardeth Sportsman, MD   OR FINDINGS:  Moderate-sized paraesophageal hiatal hernia with 25% of the stomach incarcerated in the mediastinum.  There was a 9 x 7 cm hiatal defect.   It is a primary repair over pledgets.  Mesh reinforcement was used with GORE BIO-A mesh, a biosynthetic web scaffold made of 67% polyglycolic acid (PGA): 33% trimethylene carbonate (TMC).   The patient has a Toupet (270 degree partial posterior x 4cm) fundoplication.  The patient has had anterior and posterior gastropexy.    Consults: Pharmacy, Nutrition, and Anesthesia  Hospital Course:   The patient underwent the surgery above.  Postoperatively, the patient gradually mobilized.  Esophagram study showed no leak nor obstruction but bloated stomach.  Required a few more days for improved pain control as well as well as better gastric  emptying.  Pain and other symptoms were treated aggressively.    By the time of discharge, the patient was walking well the hallways, eating pureed food, having flatus.  Pain was well-controlled on an oral medications.  Based on meeting discharge criteria and continuing to recover, I felt it was safe for the patient to be discharged from the hospital to further recover with close followup. Postoperative recommendations were discussed in detail.  They are written as well.  Discharged Condition: good  Discharge Exam: Blood pressure 118/75, pulse 75, temperature (!) 97.5 F (36.4 C), temperature source Oral, resp. rate 17, height 5\' 7"  (1.702 m), weight 106 kg, last menstrual period 01/21/2012, SpO2 95 %.  General: Pt awake/alert/oriented x4 in No acute distress Eyes: PERRL, normal EOM.  Sclera clear.  No icterus Neuro: CN II-XII intact w/o focal sensory/motor deficits. Lymph: No head/neck/groin lymphadenopathy Psych:  No delerium/psychosis/paranoia HENT: Normocephalic, Mucus membranes moist.  No thrush Neck: Supple, No tracheal deviation Chest:  No chest wall pain w good excursion.  Mild discomfort. CV:  Pulses intact.  Regular rhythm MS: Normal AROM mjr joints.  No obvious deformity Abdomen: Soft.  Mildly distended.  Mildly tender at incisions only.  No evidence of peritonitis.  No incarcerated hernias. Ext:  SCDs BLE.  No mjr edema.  No cyanosis Skin: No petechiae / purpura   Disposition:    Follow-up Information     Karie Soda, MD Follow up on 08/26/2022.   Specialties: General Surgery, Colon and Rectal Surgery Contact information: [address] [place] [address] [phone]  Discharge disposition: 01-Home or Self Care       Discharge Instructions     Call MD for:   Complete by: As directed    Temperature > 101.41F   Call MD for:  extreme fatigue   Complete by: As directed    Call MD for:  hives   Complete by: As directed     Call MD for:  persistant nausea and vomiting   Complete by: As directed    Call MD for:  redness, tenderness, or signs of infection (pain, swelling, redness, odor or green/yellow discharge around incision site)   Complete by: As directed    Call MD for:  severe uncontrolled pain   Complete by: As directed    Diet general   Complete by: As directed    SEE ESOPHAGEAL SURGERY DIET INSTRUCTIONS  We using usually start you out on a pureed (blenderized) diet. Expect some sticking with swallowing over the next 1-2 months.   This is due to swelling around your esophagus at the wrap & hiatal diaphragm repair.  It will gradually ease off over the next few months.   Discharge instructions   Complete by: As directed    Please see discharge instruction sheets.   Also refer to any handouts/printouts that may have been given from the [facility] surgery office (if you visited [place] there before surgery) Please call our office if you have any questions or concerns [phone]   Driving Restrictions   Complete by: As directed    No driving until off narcotics and can safely swerve away without pain during an emergency   Increase activity slowly   Complete by: As directed    Lifting restrictions   Complete by: As directed    Avoid heavy lifting initially, <20 pounds at first.   Do not push through pain.   You have no specific weight limit: If it hurts to do, DON'T DO IT.    If you feel no pain, you are not injuring anything.  Pain will protect you from injury.   Coughing and sneezing are far more stressful to your incision than any lifting.   Avoid resuming heavy lifting (>50 pounds) or other intense activity until off all narcotic pain medications.   When want to exercise more, give yourself 2 weeks to gradually get back to full intense exercise/activity.   May shower / Bathe   Complete by: As directed    SHOWER EVERY DAY.  It is fine for dressings or wounds to be washed/rinsed.  Use gentle soap & water.   This will help the incisions and/or wounds get clean & minimize infection.   May walk up steps   Complete by: As directed    Sexual Activity Restrictions   Complete by: As directed    Sexual activity as tolerated.  Do not push through pain.  Pain will protect you from injury.   Walk with assistance   Complete by: As directed    Walk over an hour a day.  May use a walker/cane/companion to help with balance and stamina.       Allergies as of 08/12/2022       Reactions   Nsaids Other (See Comments)   Esophageal ULCERS with bleeding        Medication List     TAKE these medications    acetaminophen 500 MG tablet Commonly known as: TYLENOL Take 1,000 mg by mouth every 6 (six) hours as needed for moderate pain.  ferrous sulfate 325 (65 FE) MG EC tablet Take 325 mg by mouth 2 (two) times daily.   metoCLOPramide 5 MG tablet Commonly known as: REGLAN Take 1 tablet (5 mg total) by mouth every 6 (six) hours as needed for nausea (bloating).   ondansetron 4 MG tablet Commonly known as: ZOFRAN Take 1 tablet (4 mg total) by mouth every 8 (eight) hours as needed for nausea.   oxyCODONE 5 MG immediate release tablet Commonly known as: Oxy IR/ROXICODONE Take 1 tablet (5 mg total) by mouth every 6 (six) hours as needed for moderate pain, severe pain or breakthrough pain.   pantoprazole 40 MG tablet Commonly known as: PROTONIX Take 1 tablet (40 mg total) by mouth daily. What changed: when to take this        Significant Diagnostic Studies:  Results for orders placed or performed during the hospital encounter of 08/08/22 (from the past 72 hour(s))  Basic metabolic panel     Status: Abnormal   Collection Time: 08/11/22  8:40 AM  Result Value Ref Range   Sodium 139 135 - 145 mmol/L   Potassium 3.9 3.5 - 5.1 mmol/L   Chloride 104 98 - 111 mmol/L   CO2 27 22 - 32 mmol/L   Glucose, Bld 108 (H) 70 - 99 mg/dL    Comment: Glucose reference range applies only to samples taken  after fasting for at least 8 hours.   BUN 13 6 - 20 mg/dL   Creatinine, Ser 1.61 0.44 - 1.00 mg/dL   Calcium 9.5 8.9 - 09.6 mg/dL   GFR, Estimated >04 >54 mL/min    Comment: (NOTE) Calculated using the CKD-EPI Creatinine Equation (2021)    Anion gap 8 5 - 15    Comment: Performed at [facility], [address] [place], [place] [address]  Magnesium     Status: None   Collection Time: 08/11/22  8:40 AM  Result Value Ref Range   Magnesium 2.1 1.7 - 2.4 mg/dL    Comment: Performed at [facility], [address], [place], [place] [address]  CBC     Status: Abnormal   Collection Time: 08/11/22  8:40 AM  Result Value Ref Range   WBC 20.8 (H) 4.0 - 10.5 K/uL   RBC 4.19 3.87 - 5.11 MIL/uL   Hemoglobin 10.2 (L) 12.0 - 15.0 g/dL   HCT 29.5 (L) 62.1 - 30.8 %   MCV 83.5 80.0 - 100.0 fL   MCH 24.3 (L) 26.0 - 34.0 pg   MCHC 29.1 (L) 30.0 - 36.0 g/dL   RDW 65.7 (H) 84.6 - 96.2 %   Platelets 421 (H) 150 - 400 K/uL   nRBC 0.0 0.0 - 0.2 %    Comment: Performed at [facility], [address] [place], [place] [address]    DG ESOPHAGUS W SINGLE CM (SOL OR THIN BA)  Result Date: 08/09/2022 CLINICAL DATA:  47 year old female1 day postop paraesophageal hiatal hernia repair and fundoplication. Evaluate for post op leak. EXAM: WATER SOLUBLE ESOPHAGRAM TECHNIQUE: Single contrast examination was performed using 50 mL of water-soluble contrast. This exam was performed by Lawernce Ion, PA-C, and was supervised and interpreted by Roque Lias, MD. FLUOROSCOPY: Radiation Exposure Index (as provided by the fluoroscopic device): 25.1 mGy Kerma COMPARISON:  None Available. FINDINGS: Surgical drain is seen in the epigastric region. Extrinsic compression is seen on the gastric cardia and GE junction from fundoplication wrap. Contrast passes freely through the esophagus and into the  stomach. No evidence of contrast leak or extravasation. No other significant  abnormality identified. Stomach is markedly enlarged, possible gastric outlet obstruction. IMPRESSION: 1. Expected postop findings from recent fundoplication. No evidence of postoperative leak or obstruction. 2. Severe gastric distension concerning for possible gastric outlet obstruction. These results will be called to the ordering clinician or representative by the Radiologist Assistant, and communication documented in the PACS or zVision Dashboard. Electronically Signed   By: Lupita Raider M.D.   On: 08/09/2022 10:06    Past Medical History:  Diagnosis Date   Anemia    Endometriosis    GERD (gastroesophageal reflux disease)    No pertinent past medical history     Past Surgical History:  Procedure Laterality Date   ABDOMINAL HYSTERECTOMY     CYSTOSCOPY  02/05/2012   Procedure: CYSTOSCOPY;  Surgeon: Delbert Harness, MD;  Location: [facility];  Service: Gynecology;  Laterality: N/A;   LAPAROSCOPIC HYSTERECTOMY  02/05/2012   Procedure: HYSTERECTOMY TOTAL LAPAROSCOPIC;  Surgeon: Delbert Harness, MD;  Location: [facility];  Service: Gynecology;  Laterality: N/A;   NO PAST SURGERIES     XI ROBOTIC ASSISTED PARAESOPHAGEAL HERNIA REPAIR N/A 08/08/2022   Procedure: ROBOTIC REPAIR INCARCERATED HIATAL HERNIA REPAIR WITH MESH AND TOUPE FUNDOPLICATION;  Surgeon: Karie Soda, MD;  Location: [facility];  Service: General;  Laterality: N/A;    Social History   Socioeconomic History   Marital status: Married    Spouse name: Not on file   Number of children: Not on file   Years of education: Not on file   Highest education level: Not on file  Occupational History   Not on file  Tobacco Use   Smoking status: Never   Smokeless tobacco: Never  Vaping Use   Vaping Use: Never used  Substance and Sexual Activity   Alcohol use: Never   Drug use: No   Sexual activity: Yes    Birth control/protection: Surgical  Other Topics Concern   Not on file  Social History Narrative   Not on file   Social  Determinants of Health   Financial Resource Strain: Not on file  Food Insecurity: No Food Insecurity (08/08/2022)   Hunger Vital Sign    Worried About Running Out of Food in the Last Year: Never true    Ran Out of Food in the Last Year: Never true  Transportation Needs: No Transportation Needs (08/08/2022)   PRAPARE - Administrator, Civil Service (Medical): No    Lack of Transportation (Non-Medical): No  Physical Activity: Not on file  Stress: Not on file  Social Connections: Not on file  Intimate Partner Violence: Not At Risk (08/08/2022)   Humiliation, Afraid, Rape, and Kick questionnaire    Fear of Current or Ex-Partner: No    Emotionally Abused: No    Physically Abused: No    Sexually Abused: No    Family History  Problem Relation Age of Onset   Sudden death Father    Heart attack Father    Diabetes Neg Hx    Hyperlipidemia Neg Hx    Hypertension Neg Hx     Current Facility-Administered Medications  Medication Dose Route Frequency Provider Last Rate Last Admin   0.9 %  sodium chloride infusion  250 mL Intravenous PRN Karie Soda, MD       acetaminophen (TYLENOL) tablet 1,000 mg  1,000 mg Oral Trecia Rogers, MD   1,000 mg at 08/12/22 0535   alum & mag  hydroxide-simeth (MAALOX/MYLANTA) 200-200-20 MG/5ML suspension 30 mL  30 mL Oral Q6H PRN Karie Soda, MD       bisacodyl (DULCOLAX) suppository 10 mg  10 mg Rectal Daily Karie Soda, MD   10 mg at 08/11/22 0829   dexamethasone (DECADRON) injection 4 mg  4 mg Intravenous Catha Gosselin, MD   4 mg at 08/12/22 7829   diphenhydrAMINE (BENADRYL) 12.5 MG/5ML elixir 12.5 mg  12.5 mg Oral Q6H PRN Karie Soda, MD       Or   diphenhydrAMINE (BENADRYL) injection 12.5 mg  12.5 mg Intravenous Q6H PRN Karie Soda, MD       enoxaparin (LOVENOX) injection 40 mg  40 mg Subcutaneous Q24H Karie Soda, MD   40 mg at 08/12/22 5621   gabapentin (NEURONTIN) capsule 400 mg  400 mg Oral TID Karie Soda, MD   400 mg at  08/12/22 3086   HYDROmorphone (DILAUDID) injection 0.5-2 mg  0.5-2 mg Intravenous Q4H PRN Karie Soda, MD   1 mg at 08/11/22 5784   magic mouthwash  15 mL Oral QID PRN Karie Soda, MD       magnesium hydroxide (MILK OF MAGNESIA) suspension 30 mL  30 mL Oral Q12H PRN Karie Soda, MD       menthol-cetylpyridinium (CEPACOL) lozenge 3 mg  1 lozenge Oral PRN Karie Soda, MD       methocarbamol (ROBAXIN) 1,000 mg in dextrose 5 % 100 mL IVPB  1,000 mg Intravenous Q6H PRN Karie Soda, MD 220 mL/hr at 08/10/22 2056 1,000 mg at 08/10/22 2056   methocarbamol (ROBAXIN) tablet 1,000 mg  1,000 mg Oral TID Karie Soda, MD   1,000 mg at 08/12/22 6962   metoCLOPramide (REGLAN) injection 5 mg  5 mg Intravenous Q8H PRN Karie Soda, MD       metoCLOPramide (REGLAN) tablet 5 mg  5 mg Oral TID AC & HS Karie Soda, MD   5 mg at 08/12/22 0833   metoprolol tartrate (LOPRESSOR) injection 5 mg  5 mg Intravenous Q6H PRN Karie Soda, MD       ondansetron (ZOFRAN-ODT) disintegrating tablet 4 mg  4 mg Oral Q6H PRN Karie Soda, MD       Or   ondansetron [facility]) injection 4 mg  4 mg Intravenous Q6H PRN Karie Soda, MD   4 mg at 08/09/22 1407   oxyCODONE (Oxy IR/ROXICODONE) immediate release tablet 10-15 mg  10-15 mg Oral Q4H PRN Karie Soda, MD   15 mg at 08/12/22 0534   pantoprazole (PROTONIX) EC tablet 40 mg  40 mg Oral Daily Karie Soda, MD   40 mg at 08/12/22 0832   phenol (CHLORASEPTIC) mouth spray 2 spray  2 spray Mouth/Throat PRN Karie Soda, MD       polyethylene glycol (MIRALAX / GLYCOLAX) packet 17 g  17 g Oral BID Karie Soda, MD   17 g at 08/12/22 0831   prochlorperazine (COMPAZINE) tablet 10 mg  10 mg Oral Q6H PRN Karie Soda, MD       Or   prochlorperazine (COMPAZINE) injection 5-10 mg  5-10 mg Intravenous Q6H PRN Karie Soda, MD       simethicone [facility]) chewable tablet 80 mg  80 mg Oral QID Karie Soda, MD   80 mg at 08/12/22 9528   sodium chloride flush (NS) 0.9 %  injection 3 mL  3 mL Intravenous Catha Gosselin, MD   3 mL at 08/12/22 0835   sodium chloride flush (NS) 0.9 % injection 3  mL  3 mL Intravenous PRN Karie Soda, MD         Allergies  Allergen Reactions   Nsaids Other (See Comments)    Esophageal ULCERS with bleeding    Signed:   Ardeth Sportsman, MD, FACS, MASCRS Esophageal, Gastrointestinal & Colorectal Surgery Robotic and Minimally Invasive Surgery  [facility] [address] [place], [place] [address] [phone] Fax [phone] Main  CONTACT INFORMATION:  Weekday (9AM-5PM): Call [facility] main office at [phone]  Weeknight (5PM-9AM) or Weekend/Holiday: Check [email or web address] (password " [email or web address]") for General Surgery [facility] coverage  (Please, do not use SecureChat as it is not reliable communication to reach operating surgeons for immediate patient care given surgeries/outpatient duties/clinic/cross-coverage/off post-call which would lead to a delay in care.  Epic staff messaging available for outptient concerns, but may not be answered for 48 hours or more).     08/12/2022, 9:09 AM

## 2022-08-12 NOTE — Progress Notes (Signed)
Reviewed written d/c instructions with pt and all questions answered. Pt verbalized understanding. Pt d/c in stable condition with all belongings.

## 2023-08-02 ENCOUNTER — Encounter: Payer: Self-pay | Admitting: Emergency Medicine

## 2023-08-02 ENCOUNTER — Ambulatory Visit
Admission: EM | Admit: 2023-08-02 | Discharge: 2023-08-02 | Disposition: A | Discharge status: Home / Self Care | Attending: Internal Medicine | Admitting: Internal Medicine

## 2023-08-02 ENCOUNTER — Other Ambulatory Visit: Payer: Self-pay

## 2023-08-02 DIAGNOSIS — K047 Periapical abscess without sinus: Secondary | ICD-10-CM

## 2023-08-02 MED ORDER — KETOROLAC TROMETHAMINE 30 MG/ML IJ SOLN
30.0000 mg | Freq: Once | INTRAMUSCULAR | Status: AC
  Administered 2023-08-02: 30 mg via INTRAMUSCULAR

## 2023-08-02 MED ORDER — AMOXICILLIN-POT CLAVULANATE 875-125 MG PO TABS: 1.0000 | ORAL_TABLET | Freq: Two times a day (BID) | ORAL | 0 refills | Status: AC

## 2023-08-02 MED ORDER — HYDROCODONE-ACETAMINOPHEN 5-325 MG PO TABS: 1.0000 | ORAL_TABLET | Freq: Four times a day (QID) | ORAL | 0 refills | Status: AC | PRN

## 2023-08-02 MED ORDER — KETOROLAC TROMETHAMINE 10 MG PO TABS: 10.0000 mg | ORAL_TABLET | Freq: Four times a day (QID) | ORAL | 0 refills | Status: AC | PRN
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
## 2023-08-02 NOTE — Discharge Instructions (Addendum)
 Symptoms and physical exam findings are consistent with a left upper dental infection from a fractured tooth.  We will treat with antibiotics and medication for pain.  Will need to follow-up with the dentist as soon as possible.  We will treat with the following: Toradol  injection given today. This is a medication to help with pain. This is not a narcotic.   Augmentin 875/125 mg twice daily for 10 days.  This is an antibiotic.  Take this with food. Hydrocodone /Tylenol  5/325 mg 1-2 every 6 hours as needed for severe pain.  This may can cause drowsiness.  Avoid driving while taking this medication.  Do not take Tylenol  while taking this medication. Toradol  10 mg 1 tablet every 6 hours as needed for moderate pain.  This medication will help with inflammation as well.  Take this with food. Schedule an appointment to see the dentist as soon as possible Return to urgent care or PCP if symptoms worsen or fail to resolve.

## 2023-08-02 NOTE — ED Provider Notes (Signed)
 [name]    CSN: [id number] Arrival date & time: 08/02/23  9562      History   Chief Complaint Chief Complaint  Patient presents with   Dental Pain    HPI [name] [name] is a 48 y.o. female.   48 year old female who presents urgent care with complaints of left facial swelling and dental pain.  She reports that this started about 2 days ago.  She has had a fractured tooth on this side for quite some time that occurred after she had another tooth extracted and this tooth was chipped during that process.  She had not had any problems with it until 2 days ago.  It began with pain around the tooth and gumline and progressed to swelling around her cheek and severe pain.  She was unable to sleep much at all last night.  She does have a low-grade fever.  She denies any difficulty breathing, tongue swelling, difficulty swallowing, chills, nausea, vomiting.  She is planning to contact her dentist on Monday to schedule an appointment to be seen as soon as possible.   Dental Pain Associated symptoms: facial swelling   Associated symptoms: no fever     Past Medical History:  Diagnosis Date   Anemia    Endometriosis    GERD (gastroesophageal reflux disease)    No pertinent past medical history     Patient Active Problem List   Diagnosis Date Noted   History of gastric ulcer 08/11/2022   Incarcerated hiatal hernia 08/08/2022   BMI 30.0-30.9,adult 06/17/2022   Iron deficiency anemia due to chronic blood loss 06/17/2022   Coccyx contusion 07/05/2015   Left ankle injury 11/24/2013   Left knee pain 05/03/2013   Low back pain 09/23/2011    Past Surgical History:  Procedure Laterality Date   ABDOMINAL HYSTERECTOMY     CYSTOSCOPY  02/05/2012   Procedure: CYSTOSCOPY;  Surgeon: Jimmey Mould, MD;  Location: [facility];  Service: Gynecology;  Laterality: N/A;   LAPAROSCOPIC HYSTERECTOMY  02/05/2012   Procedure: HYSTERECTOMY TOTAL LAPAROSCOPIC;  Surgeon: Jimmey Mould, MD;   Location: [facility];  Service: Gynecology;  Laterality: N/A;   NO PAST SURGERIES     XI ROBOTIC ASSISTED PARAESOPHAGEAL HERNIA REPAIR N/A 08/08/2022   Procedure: ROBOTIC REPAIR INCARCERATED HIATAL HERNIA REPAIR WITH MESH AND TOUPE FUNDOPLICATION;  Surgeon: Candyce Champagne, MD;  Location: [facility];  Service: General;  Laterality: N/A;    OB History   No obstetric history on file.      Home Medications    Prior to Admission medications   Medication Sig Start Date End Date Taking? Authorizing Provider  amoxicillin-clavulanate (AUGMENTIN) 875-125 MG tablet Take 1 tablet by mouth every 12 (twelve) hours for 10 days. 08/02/23 08/12/23 Yes Inza Mikrut A, PA-C  HYDROcodone -acetaminophen  (NORCO/VICODIN) 5-325 MG tablet Take 1-2 tablets by mouth every 6 (six) hours as needed for severe pain (pain score 7-10). 08/02/23  Yes Nashea Chumney A, PA-C  ketorolac  (TORADOL ) 10 MG tablet Take 1 tablet (10 mg total) by mouth every 6 (six) hours as needed. 08/02/23  Yes Albany Winslow A, PA-C  acetaminophen  (TYLENOL ) 500 MG tablet Take 1,000 mg by mouth every 6 (six) hours as needed for moderate pain.    [provider]  ferrous sulfate 325 (65 FE) MG EC tablet Take 325 mg by mouth 2 (two) times daily. 06/02/22 06/02/23  [provider]  metoCLOPramide  (REGLAN ) 5 MG tablet Take 1 tablet (5 mg total) by mouth every  6 (six) hours as needed for nausea (bloating). 08/12/22   Candyce Champagne, MD  ondansetron  (ZOFRAN ) 4 MG tablet Take 1 tablet (4 mg total) by mouth every 8 (eight) hours as needed for nausea. 08/12/22   Candyce Champagne, MD  pantoprazole  (PROTONIX ) 40 MG tablet Take 1 tablet (40 mg total) by mouth daily. 08/12/22   Candyce Champagne, MD    Family History Family History  Problem Relation Age of Onset   Sudden death Father    Heart attack Father    Diabetes Neg Hx    Hyperlipidemia Neg Hx    Hypertension Neg Hx     Social History Social History   Tobacco Use   Smoking status: Never    Smokeless tobacco: Never  Vaping Use   Vaping status: Never Used  Substance Use Topics   Alcohol use: Never   Drug use: No     Allergies   Nsaids   Review of Systems Review of Systems  Constitutional:  Negative for chills and fever.  HENT:  Positive for dental problem and facial swelling. Negative for ear pain and sore throat.   Eyes:  Negative for pain and visual disturbance.  Respiratory:  Negative for cough and shortness of breath.   Cardiovascular:  Negative for chest pain and palpitations.  Gastrointestinal:  Negative for abdominal pain and vomiting.  Genitourinary:  Negative for dysuria and hematuria.  Musculoskeletal:  Negative for arthralgias and back pain.  Skin:  Negative for color change and rash.  Neurological:  Negative for seizures and syncope.  All other systems reviewed and are negative.    Physical Exam Triage Vital Signs ED Triage Vitals  Encounter Vitals Group     BP 08/02/23 0846 (!) 166/105     Girls Systolic BP Percentile --      Girls Diastolic BP Percentile --      Boys Systolic BP Percentile --      Boys Diastolic BP Percentile --      Pulse Rate 08/02/23 0846 74     Resp 08/02/23 0846 18     Temp 08/02/23 0846 99.1 F (37.3 C)     Temp Source 08/02/23 0846 Oral     SpO2 08/02/23 0846 98 %     Weight --      Height --      Head Circumference --      Peak Flow --      Pain Score 08/02/23 0844 10     Pain Loc --      Pain Education --      Exclude from Growth Chart --    No data found.  Updated Vital Signs BP (!) 166/105 (BP Location: Right Arm)   Pulse 74   Temp 99.1 F (37.3 C) (Oral)   Resp 18   LMP 01/21/2012   SpO2 98%   Visual Acuity Right Eye Distance:   Left Eye Distance:   Bilateral Distance:    Right Eye Near:   Left Eye Near:    Bilateral Near:     Physical Exam Vitals and nursing note reviewed.  Constitutional:      General: She is not in acute distress.    Appearance: She is well-developed.  HENT:      Head: Normocephalic and atraumatic.      Right Ear: Tympanic membrane normal.     Left Ear: Tympanic membrane normal.     Nose:     Left Sinus: Maxillary sinus tenderness present.  Mouth/Throat:     Mouth: Mucous membranes are moist.     Pharynx: No pharyngeal swelling or uvula swelling.    Eyes:     Conjunctiva/sclera: Conjunctivae normal.    Cardiovascular:     Rate and Rhythm: Normal rate and regular rhythm.     Heart sounds: No murmur heard. Pulmonary:     Effort: Pulmonary effort is normal. No respiratory distress.     Breath sounds: Normal breath sounds.  Abdominal:     Palpations: Abdomen is soft.     Tenderness: There is no abdominal tenderness.   Musculoskeletal:        General: No swelling.     Cervical back: Neck supple.   Skin:    General: Skin is warm and dry.     Capillary Refill: Capillary refill takes less than 2 seconds.   Neurological:     Mental Status: She is alert.   Psychiatric:        Mood and Affect: Mood normal.      UC Treatments / Results  Labs (all labs ordered are listed, but only abnormal results are displayed) Labs Reviewed - No data to display  EKG   Radiology No results found.  Procedures Procedures (including critical care time)  Medications Ordered in UC Medications  ketorolac  (TORADOL ) 30 MG/ML injection 30 mg (30 mg Intramuscular Given 08/02/23 0904)    Initial Impression / Assessment and Plan / UC Course  I have reviewed the triage vital signs and the nursing notes.  Pertinent labs & imaging results that were available during my care of the patient were reviewed by me and considered in my medical decision making (see chart for details).     Dental infection   Symptoms and physical exam findings are consistent with a left upper dental infection from a fractured tooth.  We will treat with antibiotics and medication for pain.  Will need to follow-up with the dentist as soon as possible.  Will give a  prescription for Toradol  to use during the day to help with the pain, we have advised the patient that this is an medication that she would need to have food prior to taking.  We will also give a prescription for hydrocodone  to use at night to help with sleeping. Will treat with the following: Toradol  injection given today. This is a medication to help with pain. This is not a narcotic.   Augmentin 875/125 mg twice daily for 10 days.  This is an antibiotic.  Take this with food. Hydrocodone /Tylenol  5/325 mg 1-2 every 6 hours as needed for severe pain.  This may can cause drowsiness.  Avoid driving while taking this medication.  Do not take Tylenol  while taking this medication. Toradol  10 mg 1 tablet every 6 hours as needed for moderate pain.  This medication will help with inflammation as well.  Take this with food. Schedule an appointment to see the dentist as soon as possible Return to urgent care or PCP if symptoms worsen or fail to resolve.    Final Clinical Impressions(s) / UC Diagnoses   Final diagnoses:  Dental infection     Discharge Instructions      Symptoms and physical exam findings are consistent with a left upper dental infection from a fractured tooth.  We will treat with antibiotics and medication for pain.  Will need to follow-up with the dentist as soon as possible.  We will treat with the following: Toradol  injection given today. This is a medication  to help with pain. This is not a narcotic.   Augmentin 875/125 mg twice daily for 10 days.  This is an antibiotic.  Take this with food. Hydrocodone /Tylenol  5/325 mg 1-2 every 6 hours as needed for severe pain.  This may can cause drowsiness.  Avoid driving while taking this medication.  Do not take Tylenol  while taking this medication. Toradol  10 mg 1 tablet every 6 hours as needed for moderate pain.  This medication will help with inflammation as well.  Take this with food. Schedule an appointment to see the dentist as soon as  possible Return to urgent care or PCP if symptoms worsen or fail to resolve.      ED Prescriptions     Medication Sig Dispense Auth. Provider   amoxicillin-clavulanate (AUGMENTIN) 875-125 MG tablet Take 1 tablet by mouth every 12 (twelve) hours for 10 days. 20 tablet Marit Goodwill A, PA-C   HYDROcodone -acetaminophen  (NORCO/VICODIN) 5-325 MG tablet Take 1-2 tablets by mouth every 6 (six) hours as needed for severe pain (pain score 7-10). 10 tablet Lorenzo Romberg A, PA-C   ketorolac  (TORADOL ) 10 MG tablet Take 1 tablet (10 mg total) by mouth every 6 (six) hours as needed. 20 tablet Kreg Pesa, [place]      I have reviewed the PDMP during this encounter.   Kreg Pesa, [place] 08/02/23 [phone]
